# Patient Record
Sex: Female | Born: 1970 | Race: White | Hispanic: No | Marital: Married | Smoking: Never smoker
Health system: Southern US, Community
[De-identification: ages and names within clinical notes are randomized; demographics above are authoritative.]

## PROBLEM LIST (undated history)

## (undated) DIAGNOSIS — R319 Hematuria, unspecified: Secondary | ICD-10-CM

## (undated) DIAGNOSIS — N3000 Acute cystitis without hematuria: Secondary | ICD-10-CM

## (undated) DIAGNOSIS — N814 Uterovaginal prolapse, unspecified: Secondary | ICD-10-CM

## (undated) DIAGNOSIS — E785 Hyperlipidemia, unspecified: Secondary | ICD-10-CM

## (undated) DIAGNOSIS — F329 Major depressive disorder, single episode, unspecified: Secondary | ICD-10-CM

## (undated) DIAGNOSIS — M1990 Unspecified osteoarthritis, unspecified site: Secondary | ICD-10-CM

## (undated) DIAGNOSIS — F32A Depression, unspecified: Secondary | ICD-10-CM

## (undated) DIAGNOSIS — T7840XA Allergy, unspecified, initial encounter: Secondary | ICD-10-CM

## (undated) HISTORY — DX: Unspecified osteoarthritis, unspecified site: M19.90

## (undated) HISTORY — PX: TUBAL LIGATION: SHX77

## (undated) HISTORY — DX: Depression, unspecified: F32.A

## (undated) HISTORY — DX: Major depressive disorder, single episode, unspecified: F32.9

## (undated) HISTORY — DX: Hyperlipidemia, unspecified: E78.5

## (undated) HISTORY — DX: Allergy, unspecified, initial encounter: T78.40XA

---

## 2012-04-02 ENCOUNTER — Other Ambulatory Visit: Payer: Self-pay | Admitting: Family Medicine

## 2012-04-02 DIAGNOSIS — Z1231 Encounter for screening mammogram for malignant neoplasm of breast: Secondary | ICD-10-CM

## 2012-05-08 ENCOUNTER — Ambulatory Visit
Admission: RE | Admit: 2012-05-08 | Discharge: 2012-05-08 | Disposition: A | Discharge status: Home / Self Care | Payer: Medicare HMO | Source: Ambulatory Visit | Attending: Family Medicine | Admitting: Family Medicine

## 2012-05-08 DIAGNOSIS — Z1231 Encounter for screening mammogram for malignant neoplasm of breast: Secondary | ICD-10-CM

## 2012-05-10 ENCOUNTER — Other Ambulatory Visit: Payer: Self-pay | Admitting: Family Medicine

## 2012-05-10 DIAGNOSIS — R928 Other abnormal and inconclusive findings on diagnostic imaging of breast: Secondary | ICD-10-CM

## 2012-05-22 ENCOUNTER — Other Ambulatory Visit: Payer: Self-pay | Admitting: Family Medicine

## 2012-05-22 ENCOUNTER — Ambulatory Visit
Admission: RE | Admit: 2012-05-22 | Discharge: 2012-05-22 | Disposition: A | Discharge status: Home / Self Care | Payer: Medicare HMO | Source: Ambulatory Visit | Attending: Family Medicine | Admitting: Family Medicine

## 2012-05-22 DIAGNOSIS — R921 Mammographic calcification found on diagnostic imaging of breast: Secondary | ICD-10-CM

## 2012-05-22 DIAGNOSIS — R928 Other abnormal and inconclusive findings on diagnostic imaging of breast: Secondary | ICD-10-CM

## 2012-05-29 ENCOUNTER — Ambulatory Visit
Admission: RE | Admit: 2012-05-29 | Discharge: 2012-05-29 | Disposition: A | Discharge status: Home / Self Care | Payer: Managed Care, Other (non HMO) | Source: Ambulatory Visit | Attending: Family Medicine | Admitting: Family Medicine

## 2012-05-29 DIAGNOSIS — R921 Mammographic calcification found on diagnostic imaging of breast: Secondary | ICD-10-CM

## 2013-04-18 ENCOUNTER — Other Ambulatory Visit: Payer: Self-pay | Admitting: Family Medicine

## 2013-04-18 DIAGNOSIS — Z1231 Encounter for screening mammogram for malignant neoplasm of breast: Secondary | ICD-10-CM

## 2013-05-16 ENCOUNTER — Ambulatory Visit
Admission: RE | Admit: 2013-05-16 | Discharge: 2013-05-16 | Disposition: A | Discharge status: Home / Self Care | Payer: Private Health Insurance - Indemnity | Source: Ambulatory Visit | Attending: Family Medicine | Admitting: Family Medicine

## 2013-05-16 DIAGNOSIS — Z1231 Encounter for screening mammogram for malignant neoplasm of breast: Secondary | ICD-10-CM

## 2013-09-11 ENCOUNTER — Ambulatory Visit: Payer: Medicare HMO | Admitting: Physician Assistant

## 2013-09-11 VITALS — BP 120/70 | HR 89 | Temp 98.1°F | Resp 16 | Ht 66.0 in | Wt 146.0 lb

## 2013-09-11 DIAGNOSIS — M542 Cervicalgia: Secondary | ICD-10-CM

## 2013-09-11 MED ORDER — KETOROLAC TROMETHAMINE 60 MG/2ML IM SOLN
60.0000 mg | Freq: Once | INTRAMUSCULAR | Status: AC
  Administered 2013-09-11: 60 mg via INTRAMUSCULAR

## 2013-09-11 MED ORDER — HYDROCODONE-ACETAMINOPHEN 5-325 MG PO TABS: 1.0000 | ORAL_TABLET | Freq: Four times a day (QID) | ORAL | Status: AC | PRN

## 2013-09-11 MED ORDER — CYCLOBENZAPRINE HCL 10 MG PO TABS: 10.0000 mg | ORAL_TABLET | Freq: Three times a day (TID) | ORAL | Status: AC | PRN

## 2013-09-11 MED ORDER — MELOXICAM 15 MG PO TABS: 15.0000 mg | ORAL_TABLET | Freq: Every day | ORAL | Status: AC

## 2013-09-11 NOTE — Patient Instructions (Signed)
We have given you an injection of anti-inflammatory/pain medicine tonight  Take the cyclobenzaprine (Flexeril) every 8 hours as needed for muscle spasm - this may make you sleepy so try at bed time first  Use the Norco if needed for additional pain relief  Ice or heat to the area - whichever feels better  I expect that you will be feeling much better in the next couple of days, though it may take a couple of weeks until you feel completely well  Please let us know if any symptoms are worsening or not improving
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
## 2014-03-18 IMAGING — MG MM DIGITAL SCREENING BILAT
4 series · 4 of 4 positions shown · non-contrast
Comparison: None.

CLINICAL DATA: Screening.

DIGITAL BILATERAL SCREENING MAMMOGRAM WITH CAD

[R CC]
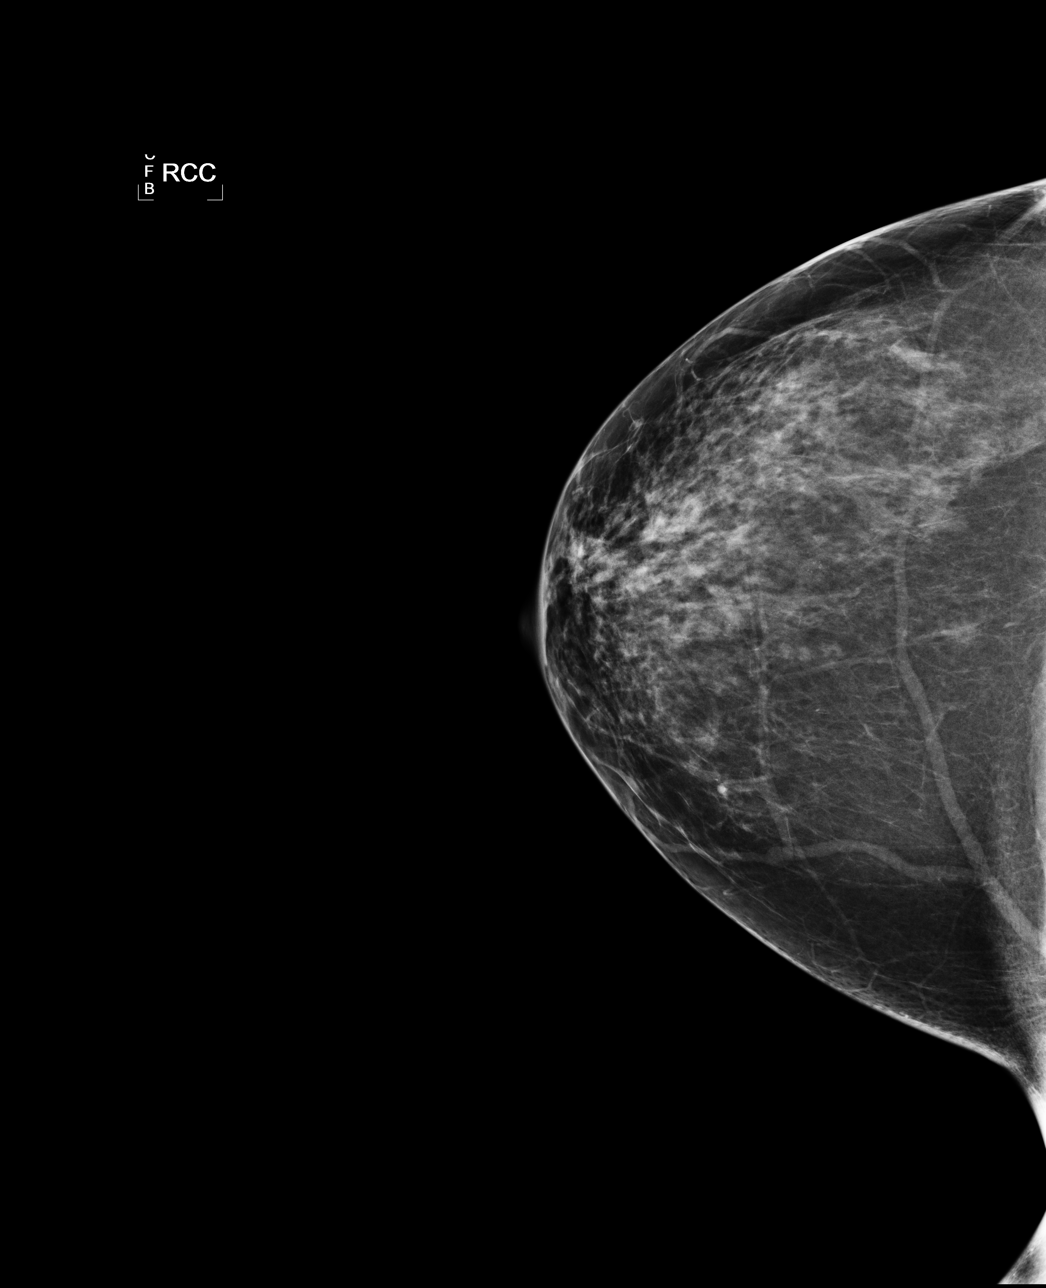

[L CC]
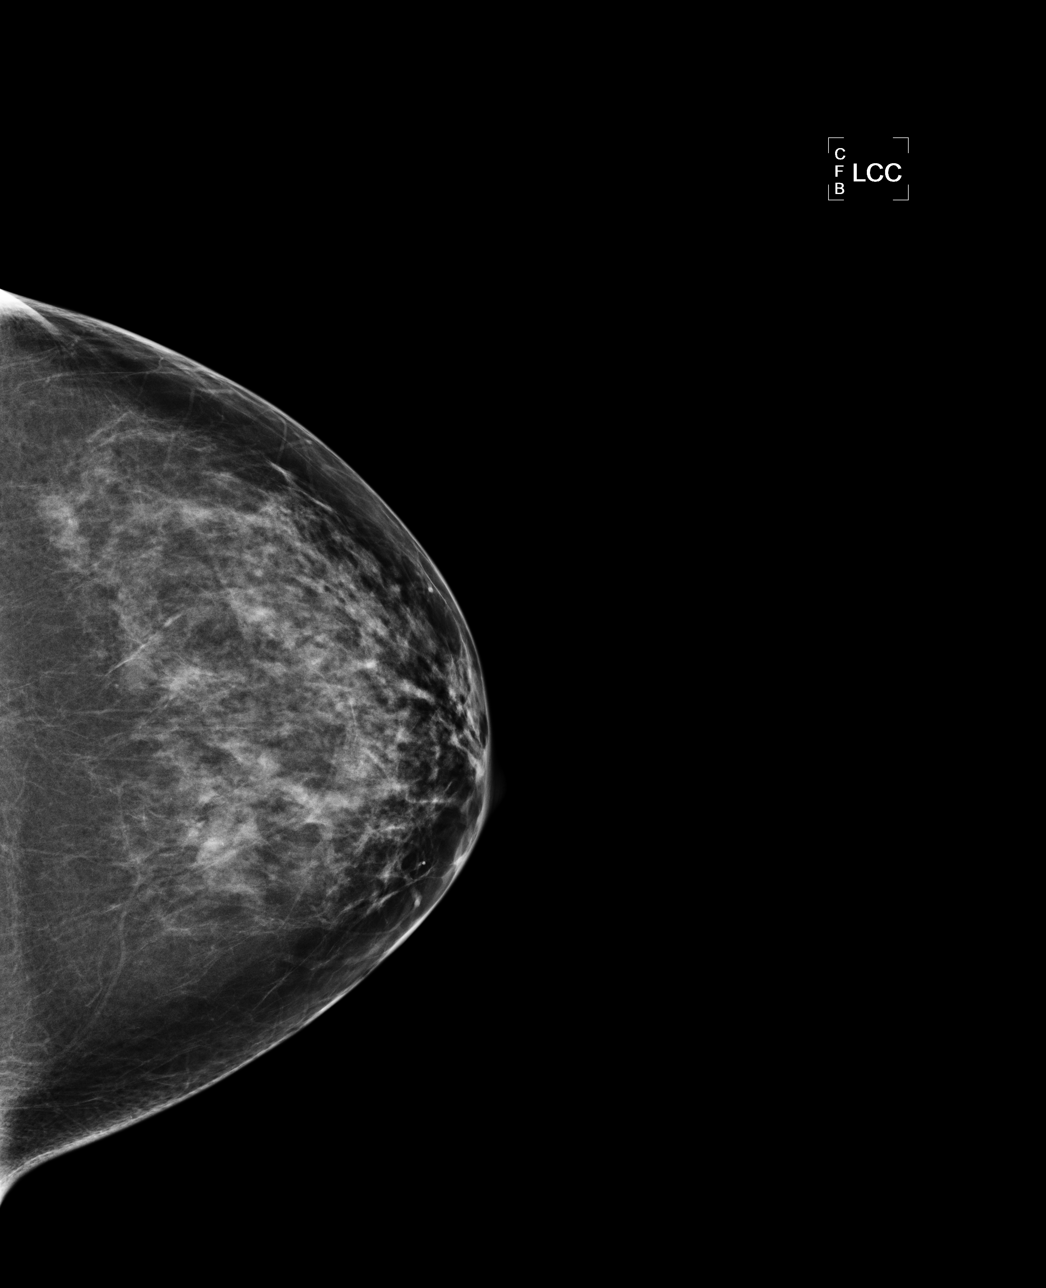

[L MLO]
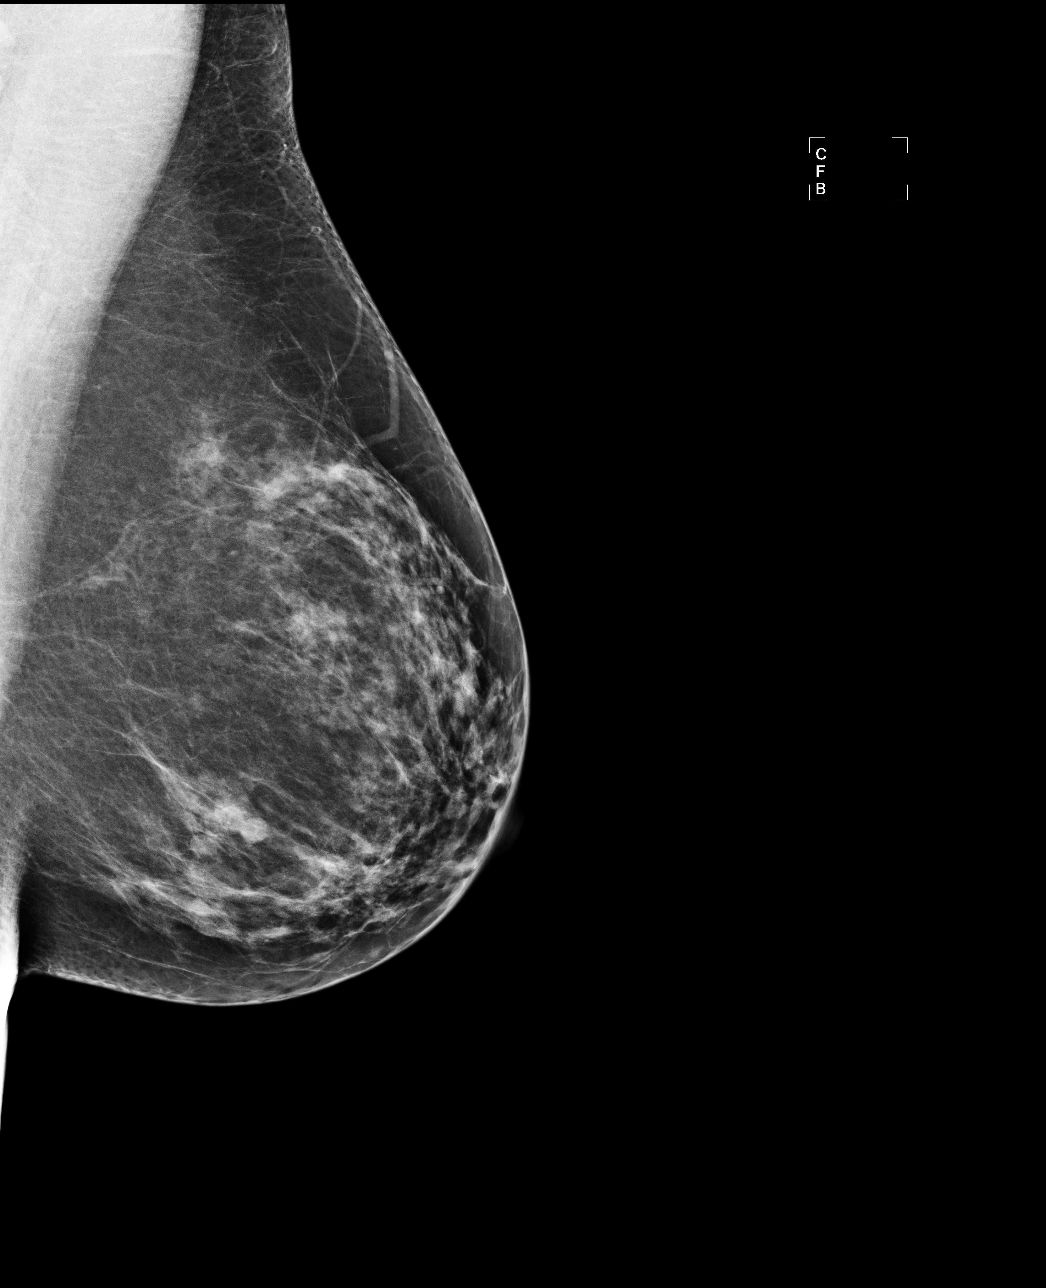

[R MLO]
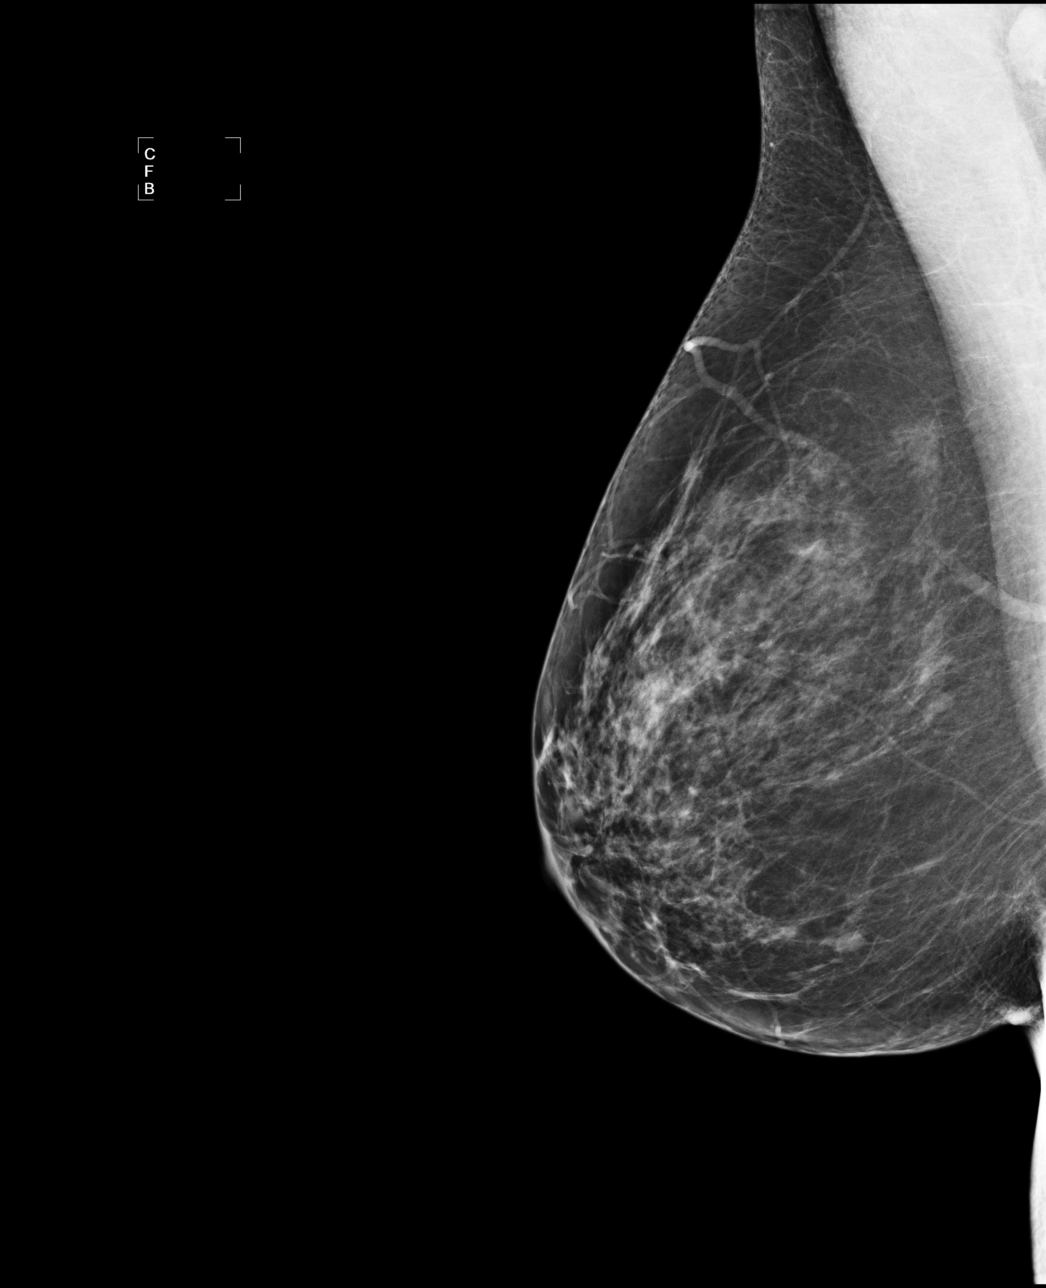

[4 of 4 positions shown; findings below may reference images not displayed]

FINDINGS: ACR Breast Density Category 2: There is a scattered fibroglandular
pattern.

In the right breast, calcifications warrant further evaluation with
magnified views.  In the left breast, there is a possible mass at
warrants further evaluation with spot compression views.

Images were processed with CAD.
IMPRESSION: Further evaluation is suggested for calcifications in the right
breast and a possible mass in the left breast.

RECOMMENDATION:
Diagnostic mammogram of both breast.  )

The patient will be contacted regarding the findings, and
additional imaging will be scheduled.

BI-RADS CATEGORY 0:  Incomplete.  Need additional imaging
evaluation and/or prior mammograms for comparison.

## 2015-03-25 LAB — PREGNANCY, URINE: HCG Urine: NEGATIVE NA

## 2015-03-25 NOTE — Op Note (Signed)
Chelsea Spence, Chelsea Spence         SURGERY DATE:         03/25/2015  MEDICAL RECORD NUMBER:     [id number]            ADMISSION DATE:       03/25/2015  ACCOUNT #:           [id number]           ADMITTING:            Stasia Cavalier, MD  DATE OF BIRTH:       [birth year]             SURGEON:              Stasia Cavalier, MD  AGE:                 [place]:                                  Electronically Authenticated                            Stasia Cavalier, MD 04/28/2015 05:21 P      Procedure:  HYSTEROSCOPY WITH NOVASURE ENDOMETRIAL ABLATION.    Preoperative Diagnosis:    Postoperative Diagnosis:    Anesthesia:  LMA.    Findings:  Normal endometrial cavity.  The uterus was very  retroverted.  There was a cervical prolapse and a cystocele noted.  The patient had previously been asymptomatic with this and the  endometrial cavity appeared normal but the patient was on menses but  there were no obvious polyps or fibroids that were noted.  The patient  had previously had a normal endometrial biopsy.    Complications:  None.    Special Medications:  None.    Patient's Condition To PACU:  Satisfactory.    Clinical History:  This is a patient who is 89, who was having heavy  menstrual bleeding and dysmenorrhea, who was counseled on medical  options and surgical options.  The patient did not want an IUD or  hormones and she was offered endometrial ablation as well.  We  discussed the risks, benefits, and alternatives to that.  She had a  normal endometrial biopsy, so she was candidate for endometrial  ablation.    Description of Procedure:  The patient was taken to the OR where she  was placed under LMA anesthesia, placed in lithotomy position.  SCDs  were on and functioning prior to anesthesia and the patient was  prepped and draped.  Uterus was very retroverted.  She also had a  cystocele and cervical prolapse, but they were currently asymptomatic  and there was initial  difficulty because of the retroverted uterus to  get the hysteroscope in, however, once this was accomplished after  dilating appropriately, we were able to visualize the cavity which was  consistent with menses, but otherwise did not show any obvious  endometrial abnormalities.  She had total uterus sounded to 8 cm, the  cavity length was 4 cm and the cavity width was 4.4 cm.  The NovaSure  was then placed and  passed the integrity test and then was activated  for 1 minute 25 seconds and then had auto shutoff.  The endometrial  ablation then was finished and the fluid deficit was 210 mL from the  hysteroscopy.  No other complications were noted.  The bladder was  drained prior to the procedure and all counts were correct.  The  patient will be discharged home with NSAIDs for pain control.    Disk[employer] Job ID: [id number]        Stasia Cavalier, MD    DOD:03/25/2015 12:22 P  PJS/dsk  DOT:03/25/2015 03:17 P  Job Number: [id number]  Document Number: [id number]  cc:   Stasia Cavalier, MD        [facility]        [address]        [place] [address]

## 2016-03-30 ENCOUNTER — Ambulatory Visit
Admit: 2016-03-30 | Discharge: 2016-03-30 | Payer: PRIVATE HEALTH INSURANCE | Attending: Surgery | Primary: Family Medicine

## 2016-03-30 DIAGNOSIS — N6452 Nipple discharge: Secondary | ICD-10-CM

## 2016-03-30 NOTE — Progress Notes (Signed)
This 45 year old female, G2 P2, status post endometrial ablation in November, 2016, was referred for breast evaluation.  She was noted to have some calcifications in her left breast on screening mammogram May, 2017.  This prompted a stereotactic core needle biopsy on 10/06/15 which showed benign fibrocystic changes.  Imaging and the procedure were done at [facility].  The patient states the procedure was complicated by a large hematoma.  Over the past month she has noted intermittent episodes of left nipple discharge which she describes as brown or slightly red in color.  She will develop a sensation of pressure behind the left nipple and with compression she is able to express a few drops of fluid with relief of the pressure.  She denies a history of infection.  She is currently on no hormone therapy and there is no family history of breast cancer.  She has also had a benign image guided biopsy of the right breast.    On exam, she is alert and oriented ??3.  Skin and sclerae are anicteric.  Chest excursions are normal.  The breasts are symmetrical and are nontender bilaterally.  There are moderate fibroglandular changes bilaterally without discrete mass noted in either breast.  I am unable to reproduce a left nipple discharge on today's exam.  No skin changes or adenopathy, either side.  Mammogram was reviewed.    Impression: Fibrocystic disease, left nipple discharge.    Plan: Proceed with follow-up left mammogram and left breast ultrasound.  Continue monthly self breast exam with annual mammogram and physician exam.

## 2016-04-08 ENCOUNTER — Encounter: Admit: 2016-04-08 | Primary: Family Medicine

## 2016-04-08 ENCOUNTER — Inpatient Hospital Stay: Admit: 2016-04-08 | Attending: Surgery | Primary: Family Medicine

## 2016-04-08 NOTE — Other (Unsigned)
Patient Acct Nbr: [id number]   Primary AUTH/CERT:   [place] Name: [place] name: Holland Falling  Primary Insurance Group Number: [id number]  Primary Insurance Plan Type: Health  Primary Insurance Policy Number: [id number]

## 2017-02-17 ENCOUNTER — Inpatient Hospital Stay: Admit: 2017-02-17 | Attending: Obstetrics & Gynecology | Primary: Family Medicine

## 2017-02-17 ENCOUNTER — Ambulatory Visit: Admit: 2017-02-17 | Primary: Family Medicine

## 2017-02-17 NOTE — Other (Unsigned)
Patient Acct Nbr: [id number]   Primary AUTH/CERT:   [place] Name: [place] name: Holland Falling  Primary Insurance Group Number: [id number]  Primary Insurance Plan Type: Health  Primary Insurance Policy Number: [id number]

## 2017-09-28 ENCOUNTER — Ambulatory Visit
Admit: 2017-09-28 | Discharge: 2017-09-28 | Payer: PRIVATE HEALTH INSURANCE | Attending: Female Pelvic Medicine and Reconstructive Surgery | Primary: Family Medicine

## 2017-09-28 DIAGNOSIS — N814 Uterovaginal prolapse, unspecified: Secondary | ICD-10-CM

## 2017-09-28 NOTE — Progress Notes (Signed)
Chief Complaint   Patient presents with   ??? New Patient       History:    Past Medical History:   Diagnosis Date   ??? Arthritis    ??? Depression    ??? Hyperlipidemia    ??? Migraines    ??? OAB (overactive bladder)        Past Surgical History:   Procedure Laterality Date   ??? ENDOMETRIAL ABLATION     ??? TUBAL LIGATION     ??? WISDOM TOOTH EXTRACTION         Family History   Problem Relation Age of Onset   ??? Thyroid Disease Mother    ??? Hypertension Father    ??? Diabetes type 2  Father    ??? High Cholesterol Father        Social History     Socioeconomic History   ??? Marital status: Married     Spouse name: None   ??? Number of children: None   ??? Years of education: None   ??? Highest education level: None   Occupational History   ??? None   Social Needs   ??? Financial resource strain: None   ??? Food insecurity:     Worry: None     Inability: None   ??? Transportation needs:     Medical: None     Non-medical: None   Tobacco Use   ??? Smoking status: Former Smoker   ??? Smokeless tobacco: Never Used   Substance and Sexual Activity   ??? Alcohol use: Yes   ??? Drug use: Never   ??? Sexual activity: Yes   Lifestyle   ??? Physical activity:     Days per week: None     Minutes per session: None   ??? Stress: None   Relationships   ??? Social connections:     Talks on phone: None     Gets together: None     Attends religious service: None     Active member of club or organization: None     Attends meetings of clubs or organizations: None     Relationship status: None   ??? Intimate partner violence:     Fear of current or ex partner: None     Emotionally abused: None     Physically abused: None     Forced sexual activity: None   Other Topics Concern   ??? None   Social History Narrative   ??? None       Allergies:    No Known Allergies    Medications:    Current Outpatient Medications on File Prior to Visit   Medication Sig Dispense Refill   ??? VASCEPA 1 g CAPS capsule TAKE 2 CAPSULES BY MOUTH TWICE A DAY  0   ??? oxybutynin (DITROPAN) 5 MG tablet TAKE 2 TABLET BY  MOUTH TWICE A DAY  4   ??? buPROPion (WELLBUTRIN XL) 300 MG extended release tablet Take 300 mg by mouth every morning     ??? rosuvastatin (CRESTOR) 5 MG tablet Take 5 mg by mouth daily       No current facility-administered medications on file prior to visit.        HPI:     Chelsea Spence is a 47 y.o. female who was referred by Dr. Dub Mikes for evaluation of urinary incontinence. This has been present for several years. She leaks urine with coughing, lifting, standing, walking, laughing, sneezing, exercise, with urge. Patient describes the symptoms as  frequent urination (severalx per day), nocturia a few times per night, urge to urinate with little or no warning, urine leakage with coughing/heavy physical activity and urine leaking unpredictably. Factors associated with symptoms include: none known. Evaluation to date includes none. Treatment to date includes oxybutynin which has helped the urge.     47 y.o. female presents with symptoms of prolapse.  She has not had a prior hysterectomy. She admits to urinary incontinence.  She describes her symptoms as moderate.     ROS:    Review of Systems   Constitutional: Positive for fatigue. Negative for appetite change, fever and unexpected weight change.   HENT: Negative for congestion, hearing loss, sore throat and voice change.    Eyes: Negative for visual disturbance.   Respiratory: Negative for cough, shortness of breath and wheezing.    Cardiovascular: Negative for chest pain, palpitations and leg swelling.   Gastrointestinal: Positive for abdominal pain. Negative for blood in stool, nausea and vomiting.   Endocrine: Positive for polydipsia and polyuria. Negative for cold intolerance and heat intolerance.   Genitourinary: Positive for dyspareunia, frequency, pelvic pain and urgency. Negative for decreased urine volume, difficulty urinating, dysuria, hematuria, vaginal bleeding, vaginal discharge and vaginal pain.   Musculoskeletal: Positive for arthralgias and back pain.  Negative for myalgias.   Allergic/Immunologic: Positive for environmental allergies.   Neurological: Negative for dizziness, seizures, numbness and headaches.   Psychiatric/Behavioral: Negative for confusion and suicidal ideas. The patient is not nervous/anxious.        Chaperone present during exam    Physical exam:    BP 118/76    Ht 5' 6"  (1.676 m)    Wt 157 lb 6.4 oz (71.4 kg)    Breastfeeding? No    BMI 25.41 kg/m??     Physical Exam   Constitutional: She is oriented to person, place, and time. She appears well-developed and well-nourished. No distress.   HENT:   Head: Normocephalic and atraumatic.   Eyes: Pupils are equal, round, and reactive to light. No scleral icterus.   Neck: Neck supple. No tracheal deviation, no edema and no erythema present. No thyromegaly present.   Pulmonary/Chest: Effort normal. No stridor. No respiratory distress. She has no wheezes.   Abdominal: Soft. She exhibits no distension and no mass. There is no hepatosplenomegaly. There is no tenderness. There is no rebound, no guarding and no CVA tenderness. No hernia.   Genitourinary: Vagina normal. Pelvic exam was performed with patient supine. No labial fusion. There is no rash, tenderness, lesion or injury on the right labia. There is no rash, tenderness, lesion or injury on the left labia. No vaginal discharge found.   Genitourinary Comments: Anal exam unremarkable, mucosa with atrophic vaginitis, anterior vaginal wall prolapse - grade 3, posterior vaginal wall prolapse - grade 3, apical prolapse - grade 2, urethral hypermobility  seen, bladder tenderness not appreciated, levator muscles nontender, sensation intact   Musculoskeletal: Normal range of motion. She exhibits no edema or tenderness.        Lumbar back: Normal. She exhibits no tenderness and no pain.   Neurological: She is alert and oriented to person, place, and time. Coordination normal.   Skin: Skin is warm and dry. No rash noted. No erythema. No pallor.   Psychiatric: She  has a normal mood and affect. Her speech is normal and behavior is normal. Judgment and thought content normal.       Assessment and Plan:    Kanon was seen today for  new patient.    Diagnoses and all orders for this visit:    Uterine prolapse    Cystocele, midline    Rectocele    Mixed incontinence  -     PR COMPLEX CYSTOMETROGRAM; Future    Urgency-frequency syndrome    Nocturia    Greater than 51% of 45 minutes was spent discussing the surgical management of prolapse. Due to her age and the degree of the prolapse, I would recommend a robotic sacrocolpopexy, rectocele repair. Will proceed with urodynamics to see if she might be a candidate for a sling. Thank you for allowing me to participate in the care of your patient.    Return uros.

## 2017-10-03 ENCOUNTER — Ambulatory Visit
Admit: 2017-10-03 | Discharge: 2017-10-03 | Payer: PRIVATE HEALTH INSURANCE | Attending: Female Pelvic Medicine and Reconstructive Surgery | Primary: Family Medicine

## 2017-10-03 DIAGNOSIS — N3946 Mixed incontinence: Secondary | ICD-10-CM

## 2017-10-03 NOTE — Progress Notes (Signed)
The patient was prepped with iodine. The test was performed in the sitting upright position with room temperature sterile water used as a distending medium. It was infused at a medium fill rate. , Catheterization with PVR done, Multichannel cystometrogram done, Urethral pressure profile at leak point, Patient tolerated the procedure well. Physician will discuss findings with patient at next visit     Diagnosis     There were no encounter diagnoses.    Uroflow    PVR at first visit:   cc  Uroflow: 654 cc  PVR:  100 cc    Cystometrogram    1st Sensation: 473  1st Urge: 647  Full: 701cc  Max: 750 cc  Uninhibited detrusor contraction:  No  Stop void:  n/a    UPP    Leak with cough: Yes  Leak with catheter out: n/a    Pressure Point   Baseline   SLPP 172  MUCP 114    Pressure Voiding Study  Amount voided at the end of the test 800 cc

## 2017-10-06 ENCOUNTER — Encounter

## 2017-10-06 NOTE — Telephone Encounter (Signed)
Uros complete,need orders

## 2017-10-06 NOTE — Telephone Encounter (Signed)
Done.  Combo with smelcer

## 2017-10-09 NOTE — Telephone Encounter (Signed)
Morrie Sheldon,   patient returned your call while you were in a room. It was not clear to me what was needed by the encounter, so I let her know you were bust at the moment but will call back later today.

## 2017-10-09 NOTE — Telephone Encounter (Signed)
Please advise 

## 2017-10-09 NOTE — Telephone Encounter (Signed)
reviewing your schedule I noticed this pt was scheduled at the wrong time for cysto. When looking at her encounter verifying that she needed the cysto, I did not see any order or notes  for one. I just wanted to clarify if she needed this appt for cysto or not. Please advise

## 2017-10-10 NOTE — Telephone Encounter (Signed)
No cysto.  Orders for surgery were entered last week

## 2017-10-10 NOTE — Telephone Encounter (Signed)
Pt appt canceled, will wait for Jasmines call to make her discuss appt

## 2017-10-24 NOTE — Telephone Encounter (Signed)
Patient called in regards to her surgery that is scheduled for 11/28/17 and states that she spoke to her gynecologist and was advised that the doctor is not available for a combo surgery for this date, and the patient is wanting to know if Dr.Rooney can just do the hysterectomy part as well and no combo case on this date as she does not want to have the surgery date pushed out even further waiting for a combo case ?

## 2017-10-30 NOTE — Telephone Encounter (Signed)
If Dr. Larinda Buttery says that is OK, then I can do the whole thing

## 2017-10-30 NOTE — Telephone Encounter (Signed)
Spoke with the patient, informed her that I would call Dr. Diona Foley office and call her back. Spoke with Clydie Braun at Dr. Diona Foley office states he was in a room and she would call me back.

## 2017-10-30 NOTE — Telephone Encounter (Signed)
Per Shaune Pascal at Dr. Diona Foley office called back states that it is ok for Dr. Durward Mallard to do the full surgery.

## 2017-10-30 NOTE — Telephone Encounter (Signed)
Left the patient a message and I will call Dr. Octaviano Batty office to check.

## 2017-10-31 ENCOUNTER — Encounter

## 2017-10-31 NOTE — Telephone Encounter (Signed)
I have it scheduled for 3 hours, someone is starting right after you.

## 2017-10-31 NOTE — Telephone Encounter (Signed)
New orders placed.  Add 30 min for hysterectomy portion

## 2017-11-03 NOTE — Telephone Encounter (Signed)
Added, Done

## 2017-11-21 ENCOUNTER — Ambulatory Visit
Admit: 2017-11-21 | Discharge: 2017-11-21 | Payer: PRIVATE HEALTH INSURANCE | Attending: Female Pelvic Medicine and Reconstructive Surgery | Primary: Family Medicine

## 2017-11-21 ENCOUNTER — Inpatient Hospital Stay: Admit: 2017-11-21 | Attending: Female Pelvic Medicine and Reconstructive Surgery | Primary: Family Medicine

## 2017-11-21 DIAGNOSIS — N8111 Cystocele, midline: Secondary | ICD-10-CM

## 2017-11-21 LAB — BASIC METABOLIC PANEL
Anion Gap: 11 NA
BUN: 14 mg/dL (ref 7–20)
CO2: 26 mmol/L (ref 22–30)
Calcium: 10.1 mg/dL (ref 8.4–10.4)
Chloride: 103 mmol/L (ref 98–107)
Creatinine: 0.73 mg/dL (ref 0.52–1.25)
Glucose: 99 mg/dL (ref 70–100)
Potassium: 4.1 mmol/L (ref 3.5–5.1)
Sodium: 141 mmol/L (ref 135–145)
eGFR AA: >60 mL/min (ref >60)
eGFR NON-AA: >60 mL/min (ref >60)

## 2017-11-21 LAB — TYPE AND SCREEN
Antibody Screen: NEGATIVE NA
Rh Type: POSITIVE NA

## 2017-11-21 LAB — HEMOGLOBIN AND HEMATOCRIT
Hematocrit: 38.5 % (ref 35.0–47.0)
Hemoglobin: 13.2 g/dL (ref 11.7–16.0)

## 2017-11-21 NOTE — Progress Notes (Signed)
Chief Complaint   Patient presents with   ??? Surgical Consult       History:    Past Medical History:   Diagnosis Date   ??? Arthritis    ??? Depression    ??? Hyperlipidemia    ??? Migraines    ??? OAB (overactive bladder)        Past Surgical History:   Procedure Laterality Date   ??? ENDOMETRIAL ABLATION     ??? TUBAL LIGATION     ??? WISDOM TOOTH EXTRACTION         Family History   Problem Relation Age of Onset   ??? Thyroid Disease Mother    ??? Hypertension Father    ??? Diabetes type 2  Father    ??? High Cholesterol Father        Social History     Socioeconomic History   ??? Marital status: Married     Spouse name: None   ??? Number of children: None   ??? Years of education: None   ??? Highest education level: None   Occupational History   ??? None   Social Needs   ??? Financial resource strain: None   ??? Food insecurity:     Worry: None     Inability: None   ??? Transportation needs:     Medical: None     Non-medical: None   Tobacco Use   ??? Smoking status: Former Smoker   ??? Smokeless tobacco: Never Used   Substance and Sexual Activity   ??? Alcohol use: Yes   ??? Drug use: Never   ??? Sexual activity: Yes   Lifestyle   ??? Physical activity:     Days per week: None     Minutes per session: None   ??? Stress: None   Relationships   ??? Social connections:     Talks on phone: None     Gets together: None     Attends religious service: None     Active member of club or organization: None     Attends meetings of clubs or organizations: None     Relationship status: None   ??? Intimate partner violence:     Fear of current or ex partner: None     Emotionally abused: None     Physically abused: None     Forced sexual activity: None   Other Topics Concern   ??? None   Social History Narrative   ??? None       Allergies:    No Known Allergies    Medications:    Current Outpatient Medications on File Prior to Visit   Medication Sig Dispense Refill   ??? rosuvastatin (CRESTOR) 10 MG tablet TAKE 1 TABLET BY MOUTH EVERY DAY  0   ??? VASCEPA 1 g CAPS capsule TAKE 2 CAPSULES  BY MOUTH TWICE A DAY  0   ??? oxybutynin (DITROPAN) 5 MG tablet TAKE 2 TABLET BY MOUTH TWICE A DAY  4   ??? buPROPion (WELLBUTRIN XL) 300 MG extended release tablet Take 300 mg by mouth every morning       No current facility-administered medications on file prior to visit.        HPI:  Chelsea Spence is a 47 y.o. female who presents to discuss her upcoming surgery for the indication of uterine prolapse, cystocele, rectocele, enterocele and stress urinary incontinence     ROS:    Review of Systems   Constitutional: Negative for appetite change, fatigue,  fever and unexpected weight change.   HENT: Negative for congestion, hearing loss, sore throat and voice change.    Eyes: Negative for visual disturbance.   Respiratory: Negative for cough, shortness of breath and wheezing.    Cardiovascular: Negative for chest pain, palpitations and leg swelling.   Gastrointestinal: Negative for abdominal pain, blood in stool, nausea and vomiting.   Endocrine: Negative for cold intolerance, heat intolerance, polydipsia and polyuria.   Genitourinary: Positive for frequency and urgency. Negative for decreased urine volume, difficulty urinating, dysuria, hematuria, pelvic pain, vaginal bleeding, vaginal discharge and vaginal pain.        Positive for a fullness possibly bladder related   Musculoskeletal: Positive for arthralgias and back pain (due to arthrits in lower back). Negative for myalgias.   Neurological: Negative for dizziness, seizures, numbness and headaches.   Hematological: Bruises/bleeds easily.   Psychiatric/Behavioral: Negative for confusion and suicidal ideas. The patient is not nervous/anxious.        Chaperone present during exam    Physical exam:    BP 112/76    Ht 5' 6"  (1.676 m)    Wt 159 lb (72.1 kg)    Breastfeeding? No    BMI 25.66 kg/m??     Physical Exam   Constitutional: She is oriented to person, place, and time. She appears well-developed and well-nourished. No distress.   HENT:   Head: Normocephalic and atraumatic.    Eyes: Pupils are equal, round, and reactive to light. No scleral icterus.   Neck: Neck supple. No tracheal deviation, no edema and no erythema present. No thyromegaly present.   Pulmonary/Chest: Effort normal. No stridor. No respiratory distress. She has no wheezes.   Abdominal: Soft. She exhibits no distension and no mass. There is no hepatosplenomegaly. There is no tenderness. There is no rebound, no guarding and no CVA tenderness. No hernia.   Musculoskeletal: Normal range of motion. She exhibits no edema or tenderness.        Lumbar back: Normal. She exhibits no tenderness and no pain.   Neurological: She is alert and oriented to person, place, and time. Coordination normal.   Skin: Skin is warm and dry. No rash noted. No erythema. No pallor.   Psychiatric: She has a normal mood and affect. Her speech is normal and behavior is normal. Judgment and thought content normal.       Assessment and Plan:    Ellagrace was seen today for surgical consult.    Diagnoses and all orders for this visit:    Cystocele, midline    Rectocele    Uterine prolapse    Mixed incontinence    Greater than 51% of 25 minutes was spent counseling and coordinating care.  Pre and postoperative orders reviewed and sheet given.  Procedure and risks discussed including but not limited to bleeding (including risk of transfusion), infection, injury to internal organs (bowel, bladder, major vasculature, ureters).  Also discussed the risk of surgical failure, urinary retention or irritability.  We discussed the risk of vaginal scarring, worsening pelvic pain and/or dyspareunia.  If mesh is being utilized, we discussed the FDA advisory on the use of mesh as well as the risk of erosion, pelvic pain, dyspareunia and the possible need for future procedures.  Consent was signed and questions were answered to the patient's satisfaction.    Return surgery.

## 2017-11-21 NOTE — Progress Notes (Signed)
Labs obtained on first attempt with 22 gauge needle on LAC site. Patient tolerated well, site benign.

## 2017-11-21 NOTE — Discharge Instructions (Signed)
CHG shower kit and instructions given to patient.    Please bring your [facility] Surgical Information folder on the day of surgery.    TAKE the following medications the morning of your surgery wellbutrin    You may take your prescription pain medications.  You may take Tylenol (Acetaminophen) if needed for pain.    No Motrin, Ibuprofen, or Advil 24 hours prior to surgery, or longer if instructed by your surgeon.     No Aleve or Naprosyn 3 days prior to surgery, or longer if instructed by your surgeon.    If you are on BLOOD THINNERS or ASPIRIN, stop any aspirin 5 days prior    Additional instructions follow any instructions that Dr Aquilla Solian may have given to you    You will receive a reminder call the day before surgery with your Same Day Surgery arrival time.    If you have specific questions, please call your surgeon.

## 2017-11-21 NOTE — H&P (Signed)
Comprehensive PreSurgical History and Physical      Name: Chelsea Spence MRN: [id number] DOB: 11/24/1970    (Age-47 y.o.)     Date of Service: Pt seen/examined on 11/21/2017     Chief Complaint:  Uterine prolapse    History Of Present Illness:    47 y.o. female who we are asked to see/evaluate by Francisco Capuchin, MD for pre-operative evaluation prior to Procedure Notes:  ROBOTIC SACROCOLPOPEXY [place].    Pt reports h/o uterine prolapse. Reports symptoms + vaginal pressure/fullness, SUI, and +  urgency/frequency/nocturia. Has tried oxybutynin with little relief. Notes symptoms have been progressively worsening over the last few years. SUI occurs withcoughing/laugh/sneeizing. No previous pessary use. Denies any reucrrent UTIs.Denies any previous surgical intervention.Electing for the above mentioned procedure. Denies fever/chills. Denies n/v/d. Denies exertional chest pain or shortness of breath. Denies hx of MI, stroke/tia, or clots.      Past Medical History:        Diagnosis Date   ??? Arthritis    ??? Depression    ??? Hyperlipidemia    ??? Migraines    ??? OAB (overactive bladder)        Past Surgical History:        Procedure Laterality Date   ??? ENDOMETRIAL ABLATION     ??? TUBAL LIGATION     ??? WISDOM TOOTH EXTRACTION         Medications Prior to Admission:    Prior to Admission medications    Medication Sig Start Date End Date Taking? Authorizing Provider   rosuvastatin (CRESTOR) 10 MG tablet TAKE 1 TABLET BY MOUTH EVERY DAY 10/12/17  Yes Historical Provider, MD   cetirizine (ZYRTEC) 10 MG tablet Take 10 mg by mouth daily   Yes Historical Provider, MD   Naproxen Sodium (ALEVE) 220 MG CAPS Take by mouth Indications: prn   Yes Historical Provider, MD   VASCEPA 1 g CAPS capsule TAKE 2 CAPSULES BY MOUTH TWICE A DAY 09/18/17  Yes Historical Provider, MD   buPROPion (WELLBUTRIN XL) 300 MG extended release tablet Take 300 mg by mouth every morning   Yes  Historical Provider, MD   oxybutynin (DITROPAN) 5 MG tablet Indications: pt takes 2 tab in am and 1 tab pm  08/10/17   Historical Provider, MD       ANTICOAGULATION: No  CHRONIC STEROID USE:  No  CHRONIC NARCOTIC USE: No    Allergies:  Patient has no known allergies.    If patient has opioid allergy, is it okay to take Acetaminophen: N/A      Social History:   TOBACCO:   reports that she has quit smoking. She has never used smokeless tobacco.  ETOH:   reports that she drank alcohol.  Social History     Substance and Sexual Activity   Drug Use Never        Family History:      Problem Relation Age of Onset   ??? Thyroid Disease Mother    ??? Hypertension Father    ??? Diabetes type 2  Father    ??? High Cholesterol Father        REVIEW OF SYSTEMS:  Pertinent positives as noted in the HPI. All other systems reviewed and negative .  \\  PHYSICAL EXAM:  Vitals:  BP 125/87    Pulse 119    Temp 98.2 ??F (36.8 ??C) (Temporal)    Resp 18    Ht 5' 6"  (1.676  m)    Wt 158 lb 12.8 oz (72 kg)    LMP 11/14/2017 (Approximate)    SpO2 97%    BMI 25.63 kg/m??   BMI Classification:  Overweight (BMI 25.0-29.9)  General appearance: No apparent distress, appears stated age and cooperative.  HEENT: Normal cephalic, atraumatic without obvious deformity. Pupils equal, round, and reactive to light.  Extra ocular muscles intact. Conjunctivae/corneas clear.  Neck: No jugular venous distention. Trachea midline.  Respiratory:  Normal respiratory effort. Clear to auscultation, bilaterally without Rales/Wheezes/Rhonchi.  Cardiovascular: Regular rate and rhythm with normal S1/S2 without murmurs, rubs or gallops. No edema present.  Abdomen: Soft, non-tender, non-distended with normal bowel sounds.  Musculoskeletal: No clubbing, cyanosis or edema bilaterally. Full ROM of all extremities.   Skin: Skin color, texture, turgor normal.  No rashes or lesions.  Neurologic:  Neurovascularly intact without any focal sensory/motor deficits. Cranial nerves: II-XII intact,  grossly non-focal.  Psychiatric: Alert and oriented, thought content appropriate, normal insight      Labs:   No results found for: WBC, HGB, HCT, MCV, PLT  No results found for: NA, K, CL, CO2, BUN, CREATININE, GLUCOSE, CALCIUM, PROT, LABALBU, BILITOT, ALKPHOS, AST, ALT, LABGLOM, GFRAA, AGRATIO, GLOB    Lee's Simple Cardiac Risk Index:  High-risk surgery (intraperitoneal, intrathoracic or suprainguinal vascular surgery): no  Coronary artery disease: no  Congestive heart failure: no  History of cerebrovascular disease: no  Insulin treatment for diabetes mellitus: no  Preoperative serum creatinine > 2.18m/dL: pending    Total:   Interpretation:  0 Points Class I   1 Point Class II   2 Points Class III   >=3 Points Class IV         OSA Screening (STOP-Bang)  DO NOT COMPLETE IF ALREADY DIAGNOSED WITH OSA  Do you snore loudly (loud enough to be heard through closed doors, or your bed partner elbows you for snoring at night)?no    Do you often feel tired, fatigued, or sleepy during the daytime (such as falling asleep during driving)?no    Has anyone observed you stop breathing or choking/gasping during your sleep?no    Do you have or are being treated for high blood pressure? no    Body mass index more than 35 kg/m2? Body mass index is 25.63 kg/m??. no    Age older than 46years old? 47y.o. no    Neck size large? (measured around Adam's apple)  For female, is your shirt collar 17 inches or larger?  no  For female, is your shirt collar 16 inches or larger?no    Gender = Female? female no    STOP BANG SCORE = 0      Information from PAT significant to anesthetic history as recorded by SKoren Bound APRN - CNP  on 11/21/2017        Anesthesia Component:    Any problems with anesthesia?: Past General Anesthetic without complications  History of difficult intubation?: None reported  History of difficult IV access?: None reported  Family History of problems with anesthesia?: None noted   Risk Factors PONV:    Female: Yes  (+1)  Motion sick or prior PONV: no  NonSmoker: Nonsmoker - (+1)  Opiods for Procedure: Yes (+1)            Airway and Dentition Patient PAT Education - Risk Assesment      ASA Score:ASA 2 - Other HLD  Mallampati  PAT assesment: 3  TMD: >4cm  Mouth Size: Limited Opening  Neck: WNL  Dentures: None  Partials: None  Overall Dentition: Teeth intact with none loose/chipped per patient  Patient informed of dental injury risk      GERD:None      Frailty Risk Assessment: Not Applicable               No Risk Factors unless listed   Patient received procedure specific education prior to surgery per nursing     Tobacco:Patient not a current smoker    ETOH:  reports that she drank alcohol.  Drug:   Social History     Substance and Sexual Activity   Drug Use Never        Functional Capacity: (>4 METS implies low cardiovascular risk from surgery):  Climb a flight of stairs or walk up a hill (5.50 METs)    PAT Pain Score:None     Postop Pain Management Plan (Pain consult ordered?): Pain consult not indicated at this time              EKG:  Not indicated.      ECHO and EF:    Not on file       ASSESSMENT/PLAN:  Patient is considered low/intermediate  risk for this intermediate risk procedure/surgery (Procedure Notes:  ROBOTIC SACROCOLPOPEXY [place]) Also, to have hysterectomy as well. Ordered H&H, T&S, and shower kit per PAT protocol. Based on the above evaluation, the benefits of the planned procedure likely exceed the risks.  The patient is medically optimized to proceed with the planned procedure without any further cardiopulmonary testing.    1)  Uterine prolapse , OAB   Deferred to surgeon   On oxybutnin       2)  Migraines   -Controlled with otc medication   -PRN naproxen      3)  HLD   -On statin    4)  Depressions   -On wellbutrin    5) Tachycardia   -Noted HR to be at 119    -Recheck was 102. Asymptomatic.       Labs Ordered:  YES - PER PAT PROTOCOL BMP, H&H,T&S,  shower kit  ECG Ordered:  NO - NOT INDICATED FOR THIS PROCEDURE  Sleep Referral Ordered:  NO - NEGATIVE SCREEN PER SLEEP REFERRAL PROTOCOL        Electronically signed by: Koren Bound, APRN - CNP     Date: 11/21/2017 at 2:35 PM               PAT Protocol referenced includes:  1) Anesthesia Lab Protocol Orders  2) Perioperative Cardiovascular Risk Assessment  3) Anesthesia Assessment   4) Pain Assessment and Acute Pain Service Consult (if appropriate)  5) Medical Clearance/Consult from Internal Medicine ([place])  6) Shower/Wash Order (for designated surgeries)  7) OSA Screen and Sleep Clinic Referral (if appropriate)

## 2017-11-21 NOTE — H&P (View-Only) (Signed)
Comprehensive PreSurgical History and Physical      Name: Chelsea Spence MRN: [id number] DOB: January 25, 1971    (Age-47 y.o.)     Date of Service: Pt seen/examined on 11/21/2017     Chief Complaint:  Uterine prolapse    History Of Present Illness:    47 y.o. female who we are asked to see/evaluate by Francisco Capuchin, MD for pre-operative evaluation prior to Procedure Notes:  ROBOTIC SACROCOLPOPEXY [place].    Pt reports h/o uterine prolapse. Reports symptoms + vaginal pressure/fullness, SUI, and +  urgency/frequency/nocturia. Has tried oxybutynin with little relief. Notes symptoms have been progressively worsening over the last few years. SUI occurs withcoughing/laugh/sneeizing. No previous pessary use. Denies any reucrrent UTIs.Denies any previous surgical intervention.Electing for the above mentioned procedure. Denies fever/chills. Denies n/v/d. Denies exertional chest pain or shortness of breath. Denies hx of MI, stroke/tia, or clots.      Past Medical History:        Diagnosis Date   ??? Arthritis    ??? Depression    ??? Hyperlipidemia    ??? Migraines    ??? OAB (overactive bladder)        Past Surgical History:        Procedure Laterality Date   ??? ENDOMETRIAL ABLATION     ??? TUBAL LIGATION     ??? WISDOM TOOTH EXTRACTION         Medications Prior to Admission:    Prior to Admission medications    Medication Sig Start Date End Date Taking? Authorizing Provider   rosuvastatin (CRESTOR) 10 MG tablet TAKE 1 TABLET BY MOUTH EVERY DAY 10/12/17  Yes Historical Provider, MD   cetirizine (ZYRTEC) 10 MG tablet Take 10 mg by mouth daily   Yes Historical Provider, MD   Naproxen Sodium (ALEVE) 220 MG CAPS Take by mouth Indications: prn   Yes Historical Provider, MD   VASCEPA 1 g CAPS capsule TAKE 2 CAPSULES BY MOUTH TWICE A DAY 09/18/17  Yes Historical Provider, MD   buPROPion (WELLBUTRIN XL) 300 MG extended release tablet Take 300 mg by mouth every morning   Yes  Historical Provider, MD   oxybutynin (DITROPAN) 5 MG tablet Indications: pt takes 2 tab in am and 1 tab pm  08/10/17   Historical Provider, MD       ANTICOAGULATION: No  CHRONIC STEROID USE:  No  CHRONIC NARCOTIC USE: No    Allergies:  Patient has no known allergies.    If patient has opioid allergy, is it okay to take Acetaminophen: N/A      Social History:   TOBACCO:   reports that she has quit smoking. She has never used smokeless tobacco.  ETOH:   reports that she drank alcohol.  Social History     Substance and Sexual Activity   Drug Use Never        Family History:      Problem Relation Age of Onset   ??? Thyroid Disease Mother    ??? Hypertension Father    ??? Diabetes type 2  Father    ??? High Cholesterol Father        REVIEW OF SYSTEMS:  Pertinent positives as noted in the HPI. All other systems reviewed and negative .  \\  PHYSICAL EXAM:  Vitals:  BP 125/87    Pulse 119    Temp 98.2 ??F (36.8 ??C) (Temporal)    Resp 18    Ht 5' 6"  (1.676  m)    Wt 158 lb 12.8 oz (72 kg)    LMP 11/14/2017 (Approximate)    SpO2 97%    BMI 25.63 kg/m??   BMI Classification:  Overweight (BMI 25.0-29.9)  General appearance: No apparent distress, appears stated age and cooperative.  HEENT: Normal cephalic, atraumatic without obvious deformity. Pupils equal, round, and reactive to light.  Extra ocular muscles intact. Conjunctivae/corneas clear.  Neck: No jugular venous distention. Trachea midline.  Respiratory:  Normal respiratory effort. Clear to auscultation, bilaterally without Rales/Wheezes/Rhonchi.  Cardiovascular: Regular rate and rhythm with normal S1/S2 without murmurs, rubs or gallops. No edema present.  Abdomen: Soft, non-tender, non-distended with normal bowel sounds.  Musculoskeletal: No clubbing, cyanosis or edema bilaterally. Full ROM of all extremities.   Skin: Skin color, texture, turgor normal.  No rashes or lesions.  Neurologic:  Neurovascularly intact without any focal sensory/motor deficits. Cranial nerves: II-XII intact,  grossly non-focal.  Psychiatric: Alert and oriented, thought content appropriate, normal insight      Labs:   No results found for: WBC, HGB, HCT, MCV, PLT  No results found for: NA, K, CL, CO2, BUN, CREATININE, GLUCOSE, CALCIUM, PROT, LABALBU, BILITOT, ALKPHOS, AST, ALT, LABGLOM, GFRAA, AGRATIO, GLOB    Lee's Simple Cardiac Risk Index:  High-risk surgery (intraperitoneal, intrathoracic or suprainguinal vascular surgery): no  Coronary artery disease: no  Congestive heart failure: no  History of cerebrovascular disease: no  Insulin treatment for diabetes mellitus: no  Preoperative serum creatinine > 2.12m/dL: pending    Total:   Interpretation:  0 Points Class I   1 Point Class II   2 Points Class III   >=3 Points Class IV         OSA Screening (STOP-Bang)  DO NOT COMPLETE IF ALREADY DIAGNOSED WITH OSA  Do you snore loudly (loud enough to be heard through closed doors, or your bed partner elbows you for snoring at night)?no    Do you often feel tired, fatigued, or sleepy during the daytime (such as falling asleep during driving)?no    Has anyone observed you stop breathing or choking/gasping during your sleep?no    Do you have or are being treated for high blood pressure? no    Body mass index more than 35 kg/m2? Body mass index is 25.63 kg/m??. no    Age older than 46years old? 47y.o. no    Neck size large? (measured around Adam's apple)  For female, is your shirt collar 17 inches or larger?  no  For female, is your shirt collar 16 inches or larger?no    Gender = Female? female no    STOP BANG SCORE = 0      Information from PAT significant to anesthetic history as recorded by SKoren Bound APRN - CNP  on 11/21/2017        Anesthesia Component:    Any problems with anesthesia?: Past General Anesthetic without complications  History of difficult intubation?: None reported  History of difficult IV access?: None reported  Family History of problems with anesthesia?: None noted   Risk Factors PONV:    Female: Yes  (+1)  Motion sick or prior PONV: no  NonSmoker: Nonsmoker - (+1)  Opiods for Procedure: Yes (+1)            Airway and Dentition Patient PAT Education - Risk Assesment      ASA Score:ASA 2 - Other HLD  Mallampati  PAT assesment: 3  TMD: >4cm  Mouth Size: Limited Opening  Neck: WNL  Dentures: None  Partials: None  Overall Dentition: Teeth intact with none loose/chipped per patient  Patient informed of dental injury risk      GERD:None      Frailty Risk Assessment: Not Applicable               No Risk Factors unless listed   Patient received procedure specific education prior to surgery per nursing     Tobacco:Patient not a current smoker    ETOH:  reports that she drank alcohol.  Drug:   Social History     Substance and Sexual Activity   Drug Use Never        Functional Capacity: (>4 METS implies low cardiovascular risk from surgery):  Climb a flight of stairs or walk up a hill (5.50 METs)    PAT Pain Score:None     Postop Pain Management Plan (Pain consult ordered?): Pain consult not indicated at this time              EKG:  Not indicated.      ECHO and EF:    Not on file       ASSESSMENT/PLAN:  Patient is considered low/intermediate  risk for this intermediate risk procedure/surgery (Procedure Notes:  ROBOTIC SACROCOLPOPEXY [place]) Also, to have hysterectomy as well. Ordered H&H, T&S, and shower kit per PAT protocol. Based on the above evaluation, the benefits of the planned procedure likely exceed the risks.  The patient is medically optimized to proceed with the planned procedure without any further cardiopulmonary testing.    1)  Uterine prolapse , OAB   Deferred to surgeon   On oxybutnin       2)  Migraines   -Controlled with otc medication   -PRN naproxen      3)  HLD   -On statin    4)  Depressions   -On wellbutrin    5) Tachycardia   -Noted HR to be at 119    -Recheck was 102. Asymptomatic.       Labs Ordered:  YES - PER PAT PROTOCOL BMP, H&H,T&S,  shower kit  ECG Ordered:  NO - NOT INDICATED FOR THIS PROCEDURE  Sleep Referral Ordered:  NO - NEGATIVE SCREEN PER SLEEP REFERRAL PROTOCOL        Electronically signed by: Koren Bound, APRN - CNP     Date: 11/21/2017 at 2:35 PM               PAT Protocol referenced includes:  1) Anesthesia Lab Protocol Orders  2) Perioperative Cardiovascular Risk Assessment  3) Anesthesia Assessment   4) Pain Assessment and Acute Pain Service Consult (if appropriate)  5) Medical Clearance/Consult from Internal Medicine ([place])  6) Shower/Wash Order (for designated surgeries)  7) OSA Screen and Sleep Clinic Referral (if appropriate)

## 2017-11-21 NOTE — Other (Unsigned)
Patient Acct Nbr: [id number]   Primary AUTH/CERT:   [place] Name: [place] name: Holland Falling  Primary Insurance Group Number: [id number]  Primary Insurance Plan Type: Health  Primary Insurance Policy Number: [id number]

## 2017-11-28 ENCOUNTER — Inpatient Hospital Stay: Payer: PRIVATE HEALTH INSURANCE

## 2017-11-28 LAB — PREGNANCY, URINE: Pregnancy, Urine: NEGATIVE NA

## 2017-11-28 MED ORDER — LABETALOL HCL 5 MG/ML IV SOLN
5 MG/ML | INTRAVENOUS | Status: DC | PRN
Start: 2017-11-28 — End: 2017-11-28

## 2017-11-28 MED ORDER — LIDOCAINE-EPINEPHRINE 1 %-1:100000 IJ SOLN
1 %-:00000 | INTRAMUSCULAR | Status: DC
Start: 2017-11-28 — End: 2017-11-28

## 2017-11-28 MED ORDER — GABAPENTIN 300 MG PO CAPS
300 MG | Freq: Once | ORAL | Status: AC
Start: 2017-11-28 — End: 2017-11-28
  Administered 2017-11-28: 10:00:00 300 mg via ORAL

## 2017-11-28 MED ORDER — KETAMINE HCL 50 MG/5ML IJ SOSY
50 MG/5ML | INTRAMUSCULAR | Status: DC
Start: 2017-11-28 — End: 2017-11-28

## 2017-11-28 MED ORDER — HYDROMORPHONE HCL 1 MG/ML IJ SOLN
1 MG/ML | INTRAMUSCULAR | Status: DC | PRN
Start: 2017-11-28 — End: 2017-11-28

## 2017-11-28 MED ORDER — NEOSTIGMINE METHYLSULFATE 1 MG/ML IJ SOLN
1 MG/ML | INTRAMUSCULAR | Status: DC
Start: 2017-11-28 — End: 2017-11-28

## 2017-11-28 MED ORDER — ALPRAZOLAM 0.25 MG PO TBDP
0.25 MG | ORAL | Status: DC | PRN
Start: 2017-11-28 — End: 2017-11-28

## 2017-11-28 MED ORDER — ESMOLOL HCL 100 MG/10ML IV SOLN
100 MG/10ML | INTRAVENOUS | Status: DC
Start: 2017-11-28 — End: 2017-11-28

## 2017-11-28 MED ORDER — ACETAMINOPHEN 500 MG PO TABS
500 MG | Freq: Once | ORAL | Status: AC
Start: 2017-11-28 — End: 2017-11-28
  Administered 2017-11-28: 10:00:00 1000 mg via ORAL

## 2017-11-28 MED ORDER — FAMOTIDINE 20 MG PO TABS
20 MG | Freq: Once | ORAL | Status: AC
Start: 2017-11-28 — End: 2017-11-28
  Administered 2017-11-28: 10:00:00 20 mg via ORAL

## 2017-11-28 MED ORDER — ROCURONIUM BROMIDE 50 MG/5ML IV SOLN
50 MG/5ML | INTRAVENOUS | Status: DC
Start: 2017-11-28 — End: 2017-11-28

## 2017-11-28 MED ORDER — OXYCODONE HCL 5 MG PO TABS
5 MG | ORAL | Status: AC | PRN
Start: 2017-11-28 — End: 2017-11-28
  Administered 2017-11-28: 15:00:00 10 mg via ORAL

## 2017-11-28 MED ORDER — GLYCOPYRROLATE 0.2 MG/ML IJ SOLN
0.2 MG/ML | INTRAMUSCULAR | Status: DC
Start: 2017-11-28 — End: 2017-11-28

## 2017-11-28 MED ORDER — MAGNESIUM SULFATE 2000 MG/50 ML IVPB PREMIX
2 GM/50ML | INTRAVENOUS | Status: DC
Start: 2017-11-28 — End: 2017-11-28

## 2017-11-28 MED ORDER — PROMETHAZINE HCL 25 MG/ML IJ SOLN
25 MG/ML | Freq: Once | INTRAMUSCULAR | Status: AC | PRN
Start: 2017-11-28 — End: 2017-11-28
  Administered 2017-11-28: 15:00:00 6.25 mg via INTRAVENOUS

## 2017-11-28 MED ORDER — MIDAZOLAM HCL 2 MG/2ML IJ SOLN
2 MG/ML | INTRAMUSCULAR | Status: DC
Start: 2017-11-28 — End: 2017-11-28

## 2017-11-28 MED ORDER — FENTANYL CITRATE (PF) 100 MCG/2ML IJ SOLN
100 MCG/2ML | INTRAMUSCULAR | Status: DC | PRN
Start: 2017-11-28 — End: 2017-11-28
  Administered 2017-11-28 (×2): 50 ug via INTRAVENOUS

## 2017-11-28 MED ORDER — SODIUM CHLORIDE 0.9 % IV SOLN
0.9 % | INTRAVENOUS | Status: DC
Start: 2017-11-28 — End: 2017-11-28

## 2017-11-28 MED ORDER — CEFAZOLIN SODIUM-DEXTROSE 2-4 GM/100ML-% IV SOLN
2-4 GM/100ML-% | INTRAVENOUS | Status: AC
Start: 2017-11-28 — End: 2017-11-28
  Administered 2017-11-28: 11:00:00 2 g via INTRAVENOUS

## 2017-11-28 MED ORDER — OXYCODONE HCL 5 MG PO TABS
5 MG | ORAL | Status: AC | PRN
Start: 2017-11-28 — End: 2017-11-28

## 2017-11-28 MED ORDER — ONDANSETRON HCL 4 MG/2ML IJ SOLN
4 MG/2ML | Freq: Once | INTRAMUSCULAR | Status: AC | PRN
Start: 2017-11-28 — End: 2017-11-28

## 2017-11-28 MED ORDER — LIDOCAINE HCL (PF) 2 % IJ SOLN
2 % | INTRAMUSCULAR | Status: DC
Start: 2017-11-28 — End: 2017-11-28

## 2017-11-28 MED ORDER — INDIGOTINDISULFONATE SODIUM 8 MG/ML IJ SOLN
8 MG/ML | INTRAMUSCULAR | Status: DC
Start: 2017-11-28 — End: 2017-11-28

## 2017-11-28 MED ORDER — NORMAL SALINE FLUSH 0.9 % IV SOLN
0.9 % | INTRAVENOUS | Status: DC | PRN
Start: 2017-11-28 — End: 2017-11-28

## 2017-11-28 MED ORDER — HYDRALAZINE HCL 20 MG/ML IJ SOLN
20 MG/ML | INTRAMUSCULAR | Status: DC | PRN
Start: 2017-11-28 — End: 2017-11-28

## 2017-11-28 MED ORDER — MEPERIDINE HCL 25 MG/ML IJ SOLN
25 MG/ML | INTRAMUSCULAR | Status: DC | PRN
Start: 2017-11-28 — End: 2017-11-28

## 2017-11-28 MED ORDER — DEXMEDETOMIDINE HCL 200 MCG/2ML IV SOLN
200 MCG/2ML | INTRAVENOUS | Status: DC
Start: 2017-11-28 — End: 2017-11-28

## 2017-11-28 MED ORDER — IBUPROFEN 600 MG PO TABS
600 MG | ORAL_TABLET | Freq: Four times a day (QID) | ORAL | 1 refills | Status: AC | PRN
Start: 2017-11-28 — End: ?

## 2017-11-28 MED ORDER — LIDOCAINE HCL (PF) 1 % IJ SOLN
1 % | Freq: Once | INTRAMUSCULAR | Status: DC | PRN
Start: 2017-11-28 — End: 2017-11-28

## 2017-11-28 MED ORDER — DEXAMETHASONE SODIUM PHOSPHATE 4 MG/ML IJ SOLN
4 MG/ML | INTRAMUSCULAR | Status: DC
Start: 2017-11-28 — End: 2017-11-28

## 2017-11-28 MED ORDER — NORMAL SALINE FLUSH 0.9 % IV SOLN
0.9 % | Freq: Two times a day (BID) | INTRAVENOUS | Status: DC
Start: 2017-11-28 — End: 2017-11-28

## 2017-11-28 MED ORDER — FENTANYL CITRATE (PF) 100 MCG/2ML IJ SOLN
100 MCG/2ML | INTRAMUSCULAR | Status: DC | PRN
Start: 2017-11-28 — End: 2017-11-28

## 2017-11-28 MED ORDER — OXYCODONE HCL 5 MG PO TABS
5 MG | ORAL_TABLET | Freq: Four times a day (QID) | ORAL | 0 refills | Status: AC | PRN
Start: 2017-11-28 — End: 2017-12-05

## 2017-11-28 MED ORDER — BUPIVACAINE-DEXAMETHASONE (TAP) SYRINGE
Status: AC
Start: 2017-11-28 — End: 2017-11-28

## 2017-11-28 MED ORDER — LACTATED RINGERS IV SOLN
INTRAVENOUS | Status: DC
Start: 2017-11-28 — End: 2017-11-28
  Administered 2017-11-28: 10:00:00 via INTRAVENOUS

## 2017-11-28 MED ORDER — PROPOFOL 200 MG/20ML IV EMUL
200 MG/20ML | INTRAVENOUS | Status: DC
Start: 2017-11-28 — End: 2017-11-28

## 2017-11-28 MED ORDER — ONDANSETRON HCL 4 MG/2ML IJ SOLN
4 MG/2ML | INTRAMUSCULAR | Status: AC
Start: 2017-11-28 — End: 2017-11-28
  Administered 2017-11-28: 14:00:00 4 via INTRAVENOUS

## 2017-11-28 MED ORDER — DIPHENHYDRAMINE HCL 50 MG/ML IJ SOLN
50 MG/ML | Freq: Once | INTRAMUSCULAR | Status: DC | PRN
Start: 2017-11-28 — End: 2017-11-28

## 2017-11-28 MED FILL — PROPOFOL 200 MG/20ML IV EMUL: 200 MG/20ML | INTRAVENOUS | Qty: 20

## 2017-11-28 MED FILL — DEXAMETHASONE SODIUM PHOSPHATE 4 MG/ML IJ SOLN: 4 mg/mL | INTRAMUSCULAR | Qty: 2

## 2017-11-28 MED FILL — KETAMINE HCL 50 MG/5ML IJ SOSY: 50 MG/5ML | INTRAMUSCULAR | Qty: 5

## 2017-11-28 MED FILL — MAGNESIUM SULFATE 2 GM/50ML IV SOLN: 2 GM/50ML | INTRAVENOUS | Qty: 50

## 2017-11-28 MED FILL — GLYCOPYRROLATE 0.2 MG/ML IJ SOLN: 0.2 mg/mL | INTRAMUSCULAR | Qty: 3

## 2017-11-28 MED FILL — ESMOLOL HCL 100 MG/10ML IV SOLN: 100 MG/10ML | INTRAVENOUS | Qty: 10

## 2017-11-28 MED FILL — GABAPENTIN 300 MG PO CAPS: 300 mg | ORAL | Qty: 1

## 2017-11-28 MED FILL — OXYCODONE HCL 5 MG PO TABS: 5 mg | ORAL | Qty: 2

## 2017-11-28 MED FILL — LIDOCAINE HCL (PF) 2 % IJ SOLN: 2 % | INTRAMUSCULAR | Qty: 5

## 2017-11-28 MED FILL — BUPIVACAINE-DEXAMETHASONE (TAP) SYRINGE: Qty: 60

## 2017-11-28 MED FILL — ONDANSETRON HCL 4 MG/2ML IJ SOLN: 4 MG/2ML | INTRAMUSCULAR | Qty: 2

## 2017-11-28 MED FILL — CEFAZOLIN SODIUM-DEXTROSE 2-4 GM/100ML-% IV SOLN: 2-4 GM/100ML-% | INTRAVENOUS | Qty: 100

## 2017-11-28 MED FILL — ROCURONIUM BROMIDE 50 MG/5ML IV SOLN: 50 MG/5ML | INTRAVENOUS | Qty: 5

## 2017-11-28 MED FILL — MAPAP 500 MG PO TABS: 500 mg | ORAL | Qty: 2

## 2017-11-28 MED FILL — FENTANYL CITRATE (PF) 100 MCG/2ML IJ SOLN: 100 MCG/2ML | INTRAMUSCULAR | Qty: 2

## 2017-11-28 MED FILL — XYLOCAINE/EPINEPHRINE 1 %-1:100000 IJ SOLN: 1 %-:00000 | INTRAMUSCULAR | Qty: 50

## 2017-11-28 MED FILL — MIDAZOLAM HCL 2 MG/2ML IJ SOLN: 2 mg/mL | INTRAMUSCULAR | Qty: 2

## 2017-11-28 MED FILL — DEXMEDETOMIDINE HCL 200 MCG/2ML IV SOLN: 200 MCG/2ML | INTRAVENOUS | Qty: 2

## 2017-11-28 MED FILL — PHENERGAN 25 MG/ML IJ SOLN: 25 mg/mL | INTRAMUSCULAR | Qty: 1

## 2017-11-28 MED FILL — INDIGO CARMINE 8 MG/ML IJ SOLN: 8 mg/mL | INTRAMUSCULAR | Qty: 5

## 2017-11-28 MED FILL — FAMOTIDINE 20 MG PO TABS: 20 mg | ORAL | Qty: 1

## 2017-11-28 MED FILL — NEOSTIGMINE METHYLSULFATE 1 MG/ML IJ SOLN: 1 mg/mL | INTRAMUSCULAR | Qty: 10

## 2017-11-28 NOTE — Discharge Instructions (Signed)
Learning About Urinary Catheter Care to Prevent Infection  What is a urinary catheter?    A urinary catheter is a flexible plastic tube used to drain urine from your bladder when you can't urinate on your own. The catheter allows urine to drain from the bladder into a bag.  Two types of drainage bags may be used with a urinary catheter.   A bedside bag is a large bag that you can hang on the side of your bed or on a chair. You can use it overnight or anytime you will be sitting or lying down for a long time.   A leg bag is a small bag that you can use during the day. It is usually attached to your thigh or calf and hidden under your clothes.  Having a urinary catheter increases your risk of getting a urinary tract infection. Germs may get on the catheter and cause an infection in your bladder or kidneys. The longer you have a catheter, the more likely it is that you will get an infection. You can help prevent this problem with good hygiene and careful handling of your catheter and drainage bags.  How can you help prevent infection?  Take care to be clean   Always wash your hands well before and after you handle your catheter.   Clean the skin around the catheter twice a day using soap and water. Dry with a clean towel afterward. You can shower with your catheter and drainage bag in place unless your doctor told you not to.   When you clean around the catheter, check the surrounding skin for signs of infection. Look for things like pus or irritated, swollen, red, or tender skin around the catheter.  Be careful with your drainage bag   Always keep the drainage bag below the level of your bladder. This will help keep urine from flowing back into your bladder.   Check often to see that urine is flowing through the catheter into the drainage bag.   Empty the drainage bag when it is half full. This will keep it from overflowing or backing up.   When you empty the drainage bag, do not let the tubing or drain  spout touch anything.  Be careful with your catheter   Do not unhook the catheter from the drain tube. That could let germs get into the tube.   Make sure that the catheter tubing does not get twisted or kinked.   Do not tug or pull on the catheter. And make sure that the drainage bag does not drag or pull on the catheter.   Do not put powder or lotion on the skin around the catheter.   Talk with your doctor about your options for sexual intercourse while wearing a catheter.  How do you empty a urine drainage bag?  If your doctor has asked you to keep a record, write down the amount of urine in the bag before you empty it.  Wash your hands before and after you touch the bag.  1. Remove the drain spout from its sleeve at the bottom of the drainage bag.  2. Open the valve on the drain spout. Let the urine flow out into the toilet or a container. Be careful not to let the tubing or drain spout touch anything.  3. After you empty the bag, close the valve. Then put the drain spout back into its sleeve at the bottom of the collection bag.  How do you add  a bedside bag to a leg bag?  Wash your hands before and after you handle the bags.  1. Empty the leg bag attached to the catheter.  2. Put a clean towel under the leg bag.  3. Use an alcohol wipe to clean the tip of the bedside bag. Then connect the bedside bag to the leg bag.  How can you clean a bedside drainage bag?  Many people clean their bedside bag in the morning if they switch to a leg bag.  To clean a bedside drainage bag:  1. Remove the bedside bag from the leg bag.  2. Fill the bag with 2 parts vinegar and 3 parts water. Let it stand for 20 minutes.  3. Empty the bag, and let it air dry.  When should you call for help?  Call your doctor now or seek immediate medical care if:   You have symptoms of a urinary infection. These may include:  ? Pain or burning when you urinate.  ? A frequent need to urinate without being able to pass much urine.  ? Pain in the  flank, which is just below the rib cage and above the waist on either side of the back.  ? Blood in your urine.  ? A fever.   Your urine smells bad.   You see large blood clots in your urine.   No urine or very little urine is flowing into the bag for 4 or more hours.  Watch closely for changes in your health, and be sure to contact your doctor if:   The area around the catheter becomes irritated, swollen, red, or tender, or there is pus draining from it.   Urine is leaking from the place where the catheter enters your body.  Follow-up care is a key part of your treatment and safety. Be sure to make and go to all appointments, and call your doctor if you are having problems. It's also a good idea to know your test results and keep a list of the medicines you take.  Where can you learn more?  Go to [email or web address] and sign in to your MyChart account. Enter U010 in the [place] box to learn more about "Learning About Urinary Catheter Care to Prevent Infection."     If you do not have an account, please click on the "Sign Up Now" link.  Current as of: April 19, 2017  Content Version: 12.0   2006-2019 [employer]. Care instructions adapted under license by [facility]. If you have questions about a medical condition or this instruction, always ask your healthcare professional. [place] any warranty or liability for your use of this information.

## 2017-11-28 NOTE — Anesthesia Post-Procedure Evaluation (Signed)
Department of Anesthesiology                             Post Anesthesia Note    Name: Chelsea Spence MRN: [id number] DOB: 11/24/1970    (Age-47 y.o.)     This note was authored with review of the notes of the PACU and/or Same day stay and/or direct communication with the patient with an anesthesia care provider.     POST  ANESTHESIA  EVALUATION      Patient Vitals for the past 2 hrs:   BP Temp Temp src Pulse Resp SpO2   11/28/17 1100 (!) 148/80 -- -- 95 17 100 %   11/28/17 1045 (!) 133/92 -- -- 86 20 99 %   11/28/17 1030 (!) 125/96 -- -- 100 16 97 %   11/28/17 1015 115/89 -- -- 98 16 100 %   11/28/17 1004 114/76 97.7 ??F (36.5 ??C) Temporal 100 18 100 %        BP (!) 148/80    Pulse 95    Temp 97.7 ??F (36.5 ??C) (Temporal)    Resp 17    Ht 5' 6"  (1.676 m)    Wt 158 lb (71.7 kg)    LMP 11/14/2017 (Approximate)    SpO2 100%    BMI 25.50 kg/m??      Pain Level: 7    Vital signs: Respiratory and Cardiovascular function within normal limits (See Nursing Record)  Level of consciousness: Patient awake/ Able to participate      Return to baseline mental status: Yes Airway status: Normal   Pain controlled: Yes Dental injury: No   Nausea/Vomiting controlled: Yes Complications: no   Well hydrated: Yes      Patient Experience comments: None expressed    Electronically signed by Landry Corporal, APRN - CRNA on 11/28/2017 at 11:20 AM

## 2017-11-28 NOTE — Progress Notes (Signed)
Dr Brigitte Pulse at bedside to remove packing

## 2017-11-28 NOTE — Progress Notes (Signed)
Husband at bedside.  

## 2017-11-28 NOTE — Progress Notes (Signed)
Dr Brigitte Pulse at bedside to see pt foley irrigated with 40m of normal saline returned back dark pink with small clots. Per Dr SBrigitte Pulseif urine gets darker to notify doctor

## 2017-11-28 NOTE — Progress Notes (Signed)
Dr Brigitte Pulse paged regarding pt foley urine bloody

## 2017-11-28 NOTE — Progress Notes (Signed)
Dr. Manuella Ghazi repaged.

## 2017-11-28 NOTE — Interval H&P Note (Signed)
Update History & Physical- (Gynecology)      Gynecologic Surgery  PHYSICIAN PRE-OPERATIVE NOTE:      Patient Name: Chelsea Spence  Patient DOB: [birth year]  Admission Date/Time: 11/28/2017  1:52 AM  Primary Care Physician: Nita Sickle, MD  [id number]        Date: 11/28/2017  Time: 6:15 AM        The patient's History and Physical was reviewed with the patient and there were no significant changes.    I examined the patient and there were no significant changes from the previous History and Physical.    Plan: The risk, benefits, expected outcome, and alternative to the recommended procedure have been discussed with the patient.  Patient understands and wants to proceed with the procedure. She is aware that there may be a need for a second procedure and there may be incomplete removal of any abnormal tissue. She was also counseled on the possibility of Bleeding, Infection, possible damage to bowel, bladder,  ureters, vasculature and surrounding organs-(described in layman's terminology to the patient).    The labs and consent were reviewed prior to the patient going to the Operating Room.      Assessment:  1. Prolapse, SUI  2.     Planned Procedure:  1. TVH, robotic sacrocolpopexy, rectocele repair, lynx, cystoscopy  2.         Electronically signed by Francisco Capuchin, MD  on 11/28/2017 at 6:15 AM

## 2017-11-28 NOTE — Brief Op Note (Signed)
Brief Postoperative Note    Chelsea Spence  Date of Birth:  Dec 10, 1970  [id number]    Pre-operative Diagnosis: Prolapse ,SUI    Post-operative Diagnosis: Same    Procedure: TVH, robotic ASC, RR, Lynx, cystoscopy    Anesthesia: General    Surgeons/Assistants: Haasini Patnaude/Shah    Estimated Blood Loss: 762     Complications: None    Specimens: Was Obtained: uterus, cervix    Findings: none    Electronically signed by Francisco Capuchin, MD on 11/28/2017 at 9:37 AM

## 2017-11-28 NOTE — Other (Unsigned)
Patient Acct Nbr: [id number]   Primary AUTH/CERT:   [place] Spence: Chelsea Spence: Holland Falling  Primary Insurance Group Number: [id number]  Primary Insurance Plan Type: Health  Primary Insurance Policy Number: [id number]

## 2017-11-28 NOTE — Op Note (Signed)
Chelsea Spence, Chelsea Spence  MEDICAL RECORD #:         [id number]  ADMISSION DATE:           11/28/2017  SURGERY DATE:             11/28/2017  ACCOUNT #:                [id number]  DATE OF BIRTH:            09-15-1970  AGE:                      47  ADMITTING PHYSICIAN:      Sharon Mt. Aquilla Solian, MD  ATTENDING PHYSICIAN:      Sharon Mt. Aquilla Solian, MD  DICTATING PHYSICIAN:      Sharon Mt. [place] Solian, MD                               OPERATIVE RECORD      Procedures:  TOTAL VAGINAL HYSTERECTOMY, WITH ANTERIOR AND POSTERIOR  COLPORRHAPHY, RECTOCELE REPAIR, LYNX SUBURETHRAL SLING AND  CYSTOSCOPY.    Preoperative Diagnoses:  Symptomatic uterovaginal prolapse and stress  urinary incontinence and urethral hypermobility.    Postoperative Diagnoses:  Symptomatic uterovaginal prolapse and stress  urinary incontinence and urethral hypermobility.    Anesthesia:  General endotracheal.    Assistant:  Dr. Sabra Heck.    Complications:  None.    Estimated Blood Loss:  100 mL.    Indications:  The patient is a 47 year old female with history of  symptomatic uterovaginal prolapse and stress urinary incontinence.  She presents to the above-mentioned procedure.  Understanding the  risks, benefits, and alternatives to include risk of bleeding,  infection, injury to internal organs including possible injury to  bowel, bladder, and major vascular.  In addition, she understands the  inherent risks of recurrent pelvic organ prolapse as well as  persistent or worsening incontinence, de novo detrusor overactivity,  postoperative voiding dysfunction, sexual dysfunction, vaginal  scarring, mesh erosion, and pelvic pain.  She understands all the  above and elects to proceed.    Description of Procedure:  The patient was taken to the operating room  where she underwent general anesthesia without difficulty.  She was  then placed in a low dorsal lithotomy position using Yellofin  stirrups, and prepped and draped in usual surgical  fashion.  Foley  catheter was placed to continuous bladder drainage.  Cervix was  grasped with single-tooth tenaculum and brought down to the level of  the introitus where cervical vaginal junction was injected with a  solution of 1% lidocaine with epinephrine.  A circumferential incision  was made in the mucosa down the cervix.  Mucosa was then dissected off  the anterior and posterior cervix until the anterior and posterior  colpotomies were performed without difficulty.  Right-angle Heaney  retractors were used to retract the bladder and rectum away from  surgical field.  The uterosacral ligaments were then crossclamped  using curved Zeppelin clamps, cut and the distal pedicle secured using  Heaney suture ligatures of 0 Vicryl.  Uterine vessels were then  crossclamped using curved Zeppelin clamps, cut and the distal pedicle  secured using Heaney suture ligatures of 0 Vicryl.  Finally, the  tuboovarian pedicles were crossclamped using curved Zeppelin clamps,  cut and distal pedicle  secured using free ties of 0 Vicryl followed by  Heaney transfixion sutures of 0 Vicryl.  There was noted to be some  bleeding from the left pelvic sidewall which was secured using  interrupted figure-of-eight suture of 2-0 Vicryl.  Hemostasis was  noted to be adequate.  The cuff was closed transversely using a  running locking suture of #0 PDS.  Attention was then turned  abdominally where a Veress needle was inserted into the patient's  umbilicus and used to inflate the patient's abdomen and carbon dioxide  gas.  A 12-mm trocar was placed above the umbilicus and  intraperitoneal placement was confirmed directly via the camera.  Additional 11 mm assistant ports were placed in the left upper and  right upper quadrant under direct visualization of the camera.  An 8  mm da Vinci trocars were placed in the left lower and right lower  quadrant under direct visualization of the camera.  The patient was  then placed in steep Trendelenburg and  the robot was docked without  difficulty.  Using monopolar scissors, then PK cautery with a  manipulator in the vagina, the bladder and the rectum were mobilized  off the anterior and posterior vagina for approximately 3.5 cm  anteriorly and 7 cm posteriorly.  The bladder was noted to be very  thin-walled until a figure-of-eight suture of 2-0 Vicryl was placed  centrally in order to reinforce the bladder mucosa.  There was no  known cystotomy or no visualized cystotomy.  This was just a  reinforcing suture due to the thin nature of the bladder mucosa.  The  peritoneal incision was then extended cephalad to the sacral  promontory taking care not to injure the right ureter and the sigmoid  colon.  The Upsylon Y-mesh with [facility] scientific was measured,  trimmed, prepared, and introduced in the patient's abdomen using #0  figure-of-eight sutures of 0 Gore-Tex.  The cephalad portion of mesh  was attached to the sacral promontory using 3 sutures of #0 Gore-Tex.  The mesh was re-peritonealized using a #0 V-Loc suture.  The cephalad  portion of the V-Loc suture was secured in place using a  figure-of-eight suture of #0 Vicryl to make sure that it did not  ______.  Ureters were noted to be peristalsing and therefore the  patient was then undocked from the robot and the trocars were removed  allowing gas to escape from the patient's abdomen.  Skin incisions  were closed using 4-0 Monocryl and Dermabond was applied.  Attention  was then turned vaginally.  Two stab incisions were created 3 cm  lateral to the midline at the level of the symphysis pubis.  The  anterior vaginal mucosa was injected with a solution of 1% lidocaine  with epinephrine.  It was then opened in the midline.  The underlying  periurethral and perivesical tissues were dissected out towards the  pubic bone bilaterally.  The [place] trocars were passed retropubically  and guided out through the ipsilateral vaginal incisions guided by a  finger.  Cystoscopy  confirmed that there was a right-sided trocar  perforation.  Therefore, the trocars were removed.  Bladder was  drained and was then re-passed without difficulty and repeat  cystoscopy confirmed that there was no trocar injury in the second  pass.  Bugbee cautery was used to control bleeding from the trocar  site until the fluid cleared enough to allow adequate visualization of  the bladder mucosa.  There was noted to be bilateral  spill of indigo  carmine.  The reinforcing suture of 0 Vicryl could be seen in the  midline above the level of the trigone.  It was noted to be watertight  however.  The Foley catheter was then placed to drainage.  Ends of the  sling were then attached to the ends of the trocar which then brought  retrograde back out through the skin incisions.  Appropriate  tensioning was obtained using a right-angle clamp and the sheath was  removed.  Redundant edges of the mesh were trimmed at the skin.  The  incisions were closed using Dermabond and the anterior vaginal mucosa  was closed using a running locking suture of 2-0 Vicryl.  Attention  was then turned to the rectocele where a finger was placed inside the  rectum.  Posterior vaginal mucosa was injected with a solution of 1%  lidocaine with epinephrine.  A diamond-shaped portion of posterior  vaginal mucosa and perineal skin was excised and discarded.  The  anterior wall of the rectum was then mobilized off the posterior  vaginal mucosa to the most distal edge of the mesh.  The fibromuscular  layer of the vagina was then plicated in the midline using interrupted  sutures of 2-0 Vicryl resulting in good reduction of the rectocele.  Redundant posterior vaginal mucosa was then trimmed and discarded and  closed using interrupted figure-of-eight sutures of 2-0 Vicryl  followed by a perineal subcuticular suture of 2-0 Vicryl.  The vagina  was then packed with 2-inch iodoform gauze.  The patient tolerated the  procedure well and was transported to the  recovery room in stable  condition.  Sponge, lap, and needle counts were correct x2 according  to nursing.    Diskriter Job ID: [id number]        Alma Aquilla Solian, MD    DOD:11/28/2017 09:50 A  CMR/dsk  DOT:11/28/2017 10:44 A  Job Number: [id number]  Document Number: [id number]  cc:   Sharon Mt. Aquilla Solian, [address]        [place] [address]

## 2017-11-28 NOTE — Progress Notes (Signed)
Dr Brigitte Pulse paged back will be down to see pt

## 2017-11-28 NOTE — Progress Notes (Signed)
Discharge instructions and new prescriptions and foley care reviewed with pt and husband verbalize understranding

## 2017-11-28 NOTE — Progress Notes (Signed)
Pt ambulated with minimal assist gait and balance steady

## 2017-11-29 LAB — SURGICAL PATHOLOGY

## 2017-11-30 MED FILL — BACITRACIN 50000 UNITS IM SOLR: 50000 [IU] | INTRAMUSCULAR | Qty: 1

## 2017-12-05 ENCOUNTER — Ambulatory Visit
Admit: 2017-12-05 | Discharge: 2017-12-05 | Payer: PRIVATE HEALTH INSURANCE | Attending: Female Pelvic Medicine and Reconstructive Surgery | Primary: Family Medicine

## 2017-12-05 DIAGNOSIS — Z09 Encounter for follow-up examination after completed treatment for conditions other than malignant neoplasm: Secondary | ICD-10-CM

## 2017-12-05 NOTE — Progress Notes (Signed)
Void Trial Procedure Note    The foley catheter was detached from the leg bag.  Using a 60 cc Toomey syringe and plunger, the bladder was backfilled with 250  cc of sterile water.  The foley catheter was removed.  The foley catheter was then removed.  The patient was prompted to void.  The patient voided 300 into the collection device on the toilet. The patient was not straight catheterized.    The patient tolerated the procedure well.    I was personally present for key portions of this procedure.      Jeannetta Ellis, MA

## 2017-12-20 ENCOUNTER — Ambulatory Visit
Admit: 2017-12-20 | Discharge: 2017-12-20 | Payer: PRIVATE HEALTH INSURANCE | Attending: Female Pelvic Medicine and Reconstructive Surgery | Primary: Family Medicine

## 2017-12-20 DIAGNOSIS — R300 Dysuria: Secondary | ICD-10-CM

## 2017-12-20 LAB — POCT URINALYSIS DIPSTICK W/O MICROSCOPE (AUTO)
Bilirubin, UA: NEGATIVE
Glucose, UA POC: NEGATIVE
Leukocytes, UA: NEGATIVE
Protein, UA POC: NEGATIVE
Spec Grav, UA: 1.025
Urobilinogen, UA: 0.2
pH, UA: 6

## 2017-12-20 MED ORDER — SULFAMETHOXAZOLE-TRIMETHOPRIM 800-160 MG PO TABS
800-160 MG | ORAL_TABLET | Freq: Two times a day (BID) | ORAL | 0 refills | Status: AC
Start: 2017-12-20 — End: 2017-12-30

## 2017-12-20 NOTE — Progress Notes (Signed)
Chief Complaint   Patient presents with   ??? Post-Op Check       History:    Past Medical History:   Diagnosis Date   ??? Arthritis    ??? Depression    ??? Hyperlipidemia    ??? Migraines    ??? OAB (overactive bladder)        Past Surgical History:   Procedure Laterality Date   ??? ENDOMETRIAL ABLATION     ??? HYSTERECTOMY, VAGINAL  11/28/2017    TOTAL VAGINAL HYSTERECTOMY, WITH ANTERIOR AND POSTERIOR COLPORRHAPHY, RECTOCELE REPAIR, LYNX SUBURETHRAL SLING AND CYSTOSCOPY   ??? TUBAL LIGATION     ??? WISDOM TOOTH EXTRACTION         Family History   Problem Relation Age of Onset   ??? Thyroid Disease Mother    ??? Hypertension Father    ??? Diabetes type 2  Father    ??? High Cholesterol Father        Social History     Socioeconomic History   ??? Marital status: Married     Spouse name: None   ??? Number of children: None   ??? Years of education: None   ??? Highest education level: None   Occupational History   ??? None   Social Needs   ??? Financial resource strain: None   ??? Food insecurity:     Worry: None     Inability: None   ??? Transportation needs:     Medical: None     Non-medical: None   Tobacco Use   ??? Smoking status: Former Smoker   ??? Smokeless tobacco: Never Used   ??? Tobacco comment: in [employer] school   Substance and Sexual Activity   ??? Alcohol use: Not Currently     Frequency: Never   ??? Drug use: Never   ??? Sexual activity: Yes   Lifestyle   ??? Physical activity:     Days per week: None     Minutes per session: None   ??? Stress: None   Relationships   ??? Social connections:     Talks on phone: None     Gets together: None     Attends religious service: None     Active member of club or organization: None     Attends meetings of clubs or organizations: None     Relationship status: None   ??? Intimate partner violence:     Fear of current or ex partner: None     Emotionally abused: None     Physically abused: None     Forced sexual activity: None   Other Topics Concern   ??? None   Social History Narrative   ??? None       Allergies:    No Known  Allergies    Medications:    Current Outpatient Medications on File Prior to Visit   Medication Sig Dispense Refill   ??? ibuprofen (ADVIL;MOTRIN) 600 MG tablet Take 1 tablet by mouth 4 times daily as needed for Pain 60 tablet 1   ??? rosuvastatin (CRESTOR) 10 MG tablet TAKE 1 TABLET BY MOUTH EVERY DAY  0   ??? cetirizine (ZYRTEC) 10 MG tablet Take 10 mg by mouth daily     ??? Naproxen Sodium (ALEVE) 220 MG CAPS Take by mouth Indications: prn     ??? VASCEPA 1 g CAPS capsule TAKE 2 CAPSULES BY MOUTH TWICE A DAY  0   ??? buPROPion (WELLBUTRIN XL) 300 MG extended release  tablet Take 300 mg by mouth every morning       No current facility-administered medications on file prior to visit.        HPI:  Patient doing well.  No complaints.  No bleeding, discharge, pain, urinary symptoms or incontinence.  No defecatory dysfunction.      ROS:    Review of Systems   Constitutional: Negative for appetite change, fatigue, fever and unexpected weight change.   HENT: Negative for congestion, hearing loss, sore throat and voice change.    Eyes: Negative for photophobia and visual disturbance.   Respiratory: Negative for cough, shortness of breath and wheezing.    Cardiovascular: Negative for chest pain, palpitations and leg swelling.   Gastrointestinal: Negative for abdominal distention, abdominal pain, blood in stool, constipation, diarrhea, nausea and vomiting.   Endocrine: Negative for cold intolerance, heat intolerance, polydipsia and polyuria.   Genitourinary: Positive for dysuria. Negative for difficulty urinating, flank pain, hematuria and urgency.   Musculoskeletal: Negative for arthralgias, back pain and myalgias.   Skin: Negative for rash and wound.   Allergic/Immunologic: Negative for environmental allergies, food allergies and immunocompromised state.   Neurological: Negative for dizziness, seizures, numbness and headaches.   Hematological: Negative for adenopathy. Does not bruise/bleed easily.   Psychiatric/Behavioral: Negative  for confusion, dysphoric mood, sleep disturbance and suicidal ideas. The patient is not nervous/anxious.        Physical exam:    BP 118/78    Ht 5' 7"  (1.702 m)    Wt 154 lb (69.9 kg)    LMP 11/14/2017 (Approximate)    BMI 24.12 kg/m??     Physical Exam  Anal exam unremarkable, mucosa with atrophic vaginitis, anterior vaginal wall prolapse - grade 0, posterior vaginal wall prolapse - grade 0, apical prolapse - grade 0, urethral hypermobility not seen, bladder tenderness not appreciated, levator muscles nontender, sensation intact  Assessment and Plan:    Chelsea Spence was seen today for post-op check.    Diagnoses and all orders for this visit:    Dysuria  -     POCT Urinalysis No Micro (Auto)  -     Urinalysis; Future  -     Urine Culture; Future  -     sulfamethoxazole-trimethoprim (BACTRIM DS) 800-160 MG per tablet; Take 1 tablet by mouth 2 times daily for 10 days    Acute cystitis without hematuria    Surgery follow-up        Return in about 3 weeks (around 01/10/2018).

## 2017-12-21 LAB — URINALYSIS
Bilirubin Urine: NEGATIVE mg/dL
Glucose, Ur: NORMAL mg/dL
Ketones, Urine: NEGATIVE mg/dL
LEUKOCYTES, UA: 500 Leu/uL
Nitrite, Urine: NEGATIVE NA
Occult Blood,Urine: 0.03 mg/dL
Specific Gravity, Urine: 1.025 NA (ref 1.005–1.030)
Total Protein, Urine: 10 mg/dL
Urobilinogen, Urine: NORMAL mg/dL
pH, Urine: 6 NA (ref 5.0–8.0)

## 2017-12-23 LAB — CULTURE, URINE: Urine Culture, Routine: >100000

## 2017-12-28 ENCOUNTER — Telehealth

## 2017-12-28 MED ORDER — NITROFURANTOIN MONOHYD MACRO 100 MG PO CAPS
100 MG | ORAL_CAPSULE | Freq: Two times a day (BID) | ORAL | 0 refills | Status: AC
Start: 2017-12-28 — End: 2018-01-07

## 2017-12-28 NOTE — Telephone Encounter (Signed)
Stop bactrim and add to patient allergy list.  Called in macrobid

## 2017-12-28 NOTE — Telephone Encounter (Signed)
Pt informed

## 2017-12-28 NOTE — Telephone Encounter (Signed)
Pt called in stating she woke up covered in a rash and itching on the bactrim. She has been taking the med for a couple of days and symptoms have not improved. Please advise

## 2018-01-16 ENCOUNTER — Ambulatory Visit
Admit: 2018-01-16 | Discharge: 2018-01-16 | Payer: PRIVATE HEALTH INSURANCE | Attending: Female Pelvic Medicine and Reconstructive Surgery | Primary: Family Medicine

## 2018-01-16 DIAGNOSIS — Z09 Encounter for follow-up examination after completed treatment for conditions other than malignant neoplasm: Secondary | ICD-10-CM

## 2018-01-16 NOTE — Progress Notes (Signed)
Chief Complaint   Patient presents with   . Follow Up After Procedure       History:    Past Medical History:   Diagnosis Date   . Arthritis    . Depression    . Hyperlipidemia    . Migraines    . OAB (overactive bladder)        Past Surgical History:   Procedure Laterality Date   . ENDOMETRIAL ABLATION     . HYSTERECTOMY, VAGINAL  11/28/2017    TOTAL VAGINAL HYSTERECTOMY, WITH ANTERIOR AND POSTERIOR COLPORRHAPHY, RECTOCELE REPAIR, LYNX SUBURETHRAL SLING AND CYSTOSCOPY   . TUBAL LIGATION     . WISDOM TOOTH EXTRACTION         Family History   Problem Relation Age of Onset   . Thyroid Disease Mother    . Hypertension Father    . Diabetes type 2  Father    . High Cholesterol Father        Social History     Socioeconomic History   . Marital status: Married     Spouse name: None   . Number of children: None   . Years of education: None   . Highest education level: None   Occupational History   . None   Social Needs   . Financial resource strain: None   . Food insecurity:     Worry: None     Inability: None   . Transportation needs:     Medical: None     Non-medical: None   Tobacco Use   . Smoking status: Former Games developer   . Smokeless tobacco: Never Used   . Tobacco comment: in [employer]   Substance and Sexual Activity   . Alcohol use: Not Currently     Frequency: Never   . Drug use: Never   . Sexual activity: Yes   Lifestyle   . Physical activity:     Days per week: None     Minutes per session: None   . Stress: None   Relationships   . Social connections:     Talks on phone: None     Gets together: None     Attends religious service: None     Active member of club or organization: None     Attends meetings of clubs or organizations: None     Relationship status: None   . Intimate partner violence:     Fear of current or ex partner: None     Emotionally abused: None     Physically abused: None     Forced sexual activity: None   Other Topics Concern   . None   Social History Narrative   . None        Allergies:    Allergies   Allergen Reactions   . Bactrim [Sulfamethoxazole-Trimethoprim] Itching and Rash       Medications:    Current Outpatient Medications on File Prior to Visit   Medication Sig Dispense Refill   . ibuprofen (ADVIL;MOTRIN) 600 MG tablet Take 1 tablet by mouth 4 times daily as needed for Pain 60 tablet 1   . rosuvastatin (CRESTOR) 10 MG tablet TAKE 1 TABLET BY MOUTH EVERY DAY  0   . cetirizine (ZYRTEC) 10 MG tablet Take 10 mg by mouth daily     . Naproxen Sodium (ALEVE) 220 MG CAPS Take by mouth Indications: prn     . VASCEPA 1 g CAPS capsule TAKE 2 CAPSULES BY MOUTH  TWICE A DAY  0   . buPROPion (WELLBUTRIN XL) 300 MG extended release tablet Take 300 mg by mouth every morning       No current facility-administered medications on file prior to visit.        HPI:  Patient doing well.  No complaints.  No bleeding, discharge, pain, urinary symptoms or incontinence.  No defecatory dysfunction.      ROS:    Review of Systems   Constitutional: Negative for appetite change, fatigue, fever and unexpected weight change.   HENT: Negative for congestion, hearing loss, sore throat and voice change.    Eyes: Negative for photophobia and visual disturbance.   Respiratory: Negative for cough, shortness of breath and wheezing.    Cardiovascular: Negative for chest pain, palpitations and leg swelling.   Gastrointestinal: Negative for abdominal distention, abdominal pain, blood in stool, constipation, diarrhea, nausea and vomiting.   Endocrine: Negative for cold intolerance, heat intolerance, polydipsia and polyuria.   Genitourinary: Negative for difficulty urinating, dysuria, flank pain, frequency, hematuria and urgency.   Musculoskeletal: Negative for arthralgias, back pain and myalgias.   Skin: Negative for rash and wound.   Allergic/Immunologic: Negative for environmental allergies, food allergies and immunocompromised state.   Neurological: Negative for dizziness, seizures, numbness and headaches.    Hematological: Negative for adenopathy. Does not bruise/bleed easily.   Psychiatric/Behavioral: Negative for confusion, dysphoric mood, sleep disturbance and suicidal ideas. The patient is not nervous/anxious.        Physical exam:    BP 116/64   Ht 5\' 6"  (1.676 m)   Wt 155 lb (70.3 kg)   BMI 25.02 kg/m     Physical Exam  Anal exam unremarkable, mucosa with atrophic vaginitis, anterior vaginal wall prolapse - grade 0, posterior vaginal wall prolapse - grade 0, apical prolapse - grade 0, urethral hypermobility not seen, bladder tenderness not appreciated, levator muscles nontender, sensation intact  Assessment and Plan:    Sahithi was seen today for follow up after procedure.    Diagnoses and all orders for this visit:    Surgery follow-up        Return in about 6 weeks (around 02/27/2018).

## 2018-02-28 ENCOUNTER — Encounter: Attending: Female Pelvic Medicine and Reconstructive Surgery | Primary: Family Medicine

## 2018-03-20 ENCOUNTER — Inpatient Hospital Stay: Admit: 2018-03-20 | Attending: Obstetrics & Gynecology | Primary: Family Medicine

## 2018-03-20 NOTE — Other (Unsigned)
Patient Acct Nbr: [id number]   Primary AUTH/CERT:   Primary Insurance Company Name: [employer]  Primary Insurance Plan name: Monia Pouch  Primary Insurance Group Number: [id number]  Primary Insurance Plan Type: Health  Primary Insurance Policy Number: [id number]

## 2018-04-05 ENCOUNTER — Telehealth: Payer: PRIVATE HEALTH INSURANCE

## 2018-04-05 LAB — POCT URINALYSIS DIPSTICK W/O MICROSCOPE (AUTO)
Bilirubin, UA: NEGATIVE
Glucose, UA POC: NEGATIVE
Ketones, UA: NEGATIVE
Nitrite, UA: NEGATIVE
Protein, UA POC: NEGATIVE
Spec Grav, UA: 1.01
Urobilinogen, UA: 0.2
pH, UA: 7

## 2018-04-05 MED ORDER — NITROFURANTOIN MONOHYD MACRO 100 MG PO CAPS
100 MG | ORAL_CAPSULE | Freq: Two times a day (BID) | ORAL | 0 refills | Status: AC
Start: 2018-04-05 — End: 2018-04-15

## 2018-04-05 NOTE — Telephone Encounter (Signed)
Patient called in this am and states that she has some pelvic pressure and felt like she would go to the restroom and empty and then have to go back to urinate again.She seen blood pinkish and was wondering if this was normal. Patient states she was advised to come in to give a urine sample this am. POCT positive for small blood and small leucytes. Patient wants to be started on the last antibiotic you gave her last time that seemed to work the best per the patient. I pend the orders to you.

## 2018-04-05 NOTE — Telephone Encounter (Signed)
Called in [place]

## 2018-04-05 NOTE — Telephone Encounter (Signed)
Patient notified of the message and acknowledged.

## 2018-04-06 LAB — URINALYSIS
Bilirubin Urine: NEGATIVE mg/dL
Glucose, Ur: NORMAL mg/dL (ref <70)
Ketones, Urine: NEGATIVE mg/dL
LEUKOCYTES, UA: 75 Leu/uL
Nitrite, Urine: NEGATIVE NA
Occult Blood,Urine: 0.03 mg/dL
Specific Gravity, Urine: 1.005 NA (ref 1.005–1.030)
Total Protein, Urine: NEGATIVE mg/dL
Urobilinogen, Urine: NORMAL mg/dL (ref 0–1)
pH, Urine: 6.5 NA (ref 5.0–8.0)

## 2018-04-07 LAB — CULTURE, URINE: Urine Culture, Routine: NORMAL

## 2019-03-22 ENCOUNTER — Inpatient Hospital Stay: Admit: 2019-03-22 | Attending: Obstetrics & Gynecology | Primary: Family Medicine

## 2019-03-22 NOTE — Other (Unsigned)
Patient Acct Nbr: [id number]   Primary AUTH/CERT:   [place] Name: [place] name: Christella Scheuermann  Primary Insurance Group Number: [id number]  Primary Insurance Plan Type: Health  Primary Insurance Policy Number: [id number]

## 2020-03-23 ENCOUNTER — Inpatient Hospital Stay: Admit: 2020-03-23 | Attending: Obstetrics & Gynecology | Primary: Family Medicine

## 2020-03-23 NOTE — Other (Unsigned)
Patient Acct Nbr: [id number]   Primary AUTH/CERT:   [place] Name: [place] name: Chelsea Spence  Primary Insurance Group Number: [id number]  Primary Insurance Plan Type: Health  Primary Insurance Policy Number: [id number]

## 2020-12-28 NOTE — Telephone Encounter (Signed)
In order to comply with, [place] Act, Barry Brunner is providing you with the following attachments. Any Good Faith Estimate that may be provided are based on the services you are scheduled to receive. During your visit, there may be additional services required in order for the provider to complete your plan of care.      Patient declined rights and protections.

## 2020-12-28 NOTE — Telephone Encounter (Signed)
Name of Caller: Chelsea Spence    Relation to patient: Patient    Contact Phone Number: [phone]    Appointment Scheduled with: Dr. Stann Ore    Appointment Date & Time: 04/02/2021 at 9:20 am    Reason for visit (are you having any symptoms) : New patient/Establish care    Transportation Issues/ concerns: na    Special Accommodations? ( wheel chair, etc) : na    Current medications: na    Any refills need?: na    Any chronic conditions you would like the physician to know of?: na

## 2021-03-24 ENCOUNTER — Ambulatory Visit: Primary: Family Medicine

## 2021-04-02 ENCOUNTER — Encounter: Payer: PRIVATE HEALTH INSURANCE | Attending: Family Medicine | Primary: Family Medicine
